# Patient Record
Sex: Female | Born: 1980
Health system: Southern US, Community
[De-identification: ages and names within clinical notes are randomized; demographics above are authoritative.]

## PROBLEM LIST (undated history)

## (undated) ENCOUNTER — Inpatient Hospital Stay (HOSPITAL_COMMUNITY): Payer: Self-pay

## (undated) DIAGNOSIS — B009 Herpesviral infection, unspecified: Secondary | ICD-10-CM

## (undated) DIAGNOSIS — Z789 Other specified health status: Secondary | ICD-10-CM

## (undated) HISTORY — PX: LEEP: SHX91

## (undated) HISTORY — PX: NO PAST SURGERIES: SHX2092

---

## 2001-10-20 ENCOUNTER — Other Ambulatory Visit: Admission: RE | Admit: 2001-10-20 | Discharge: 2001-10-20 | Payer: Self-pay | Admitting: Obstetrics and Gynecology

## 2003-03-31 ENCOUNTER — Other Ambulatory Visit: Admission: RE | Admit: 2003-03-31 | Discharge: 2003-03-31 | Payer: Self-pay | Admitting: Obstetrics and Gynecology

## 2004-02-04 ENCOUNTER — Other Ambulatory Visit: Admission: RE | Admit: 2004-02-04 | Discharge: 2004-02-04 | Payer: Self-pay | Admitting: Obstetrics and Gynecology

## 2005-01-26 ENCOUNTER — Inpatient Hospital Stay (HOSPITAL_COMMUNITY): Admission: AD | Admit: 2005-01-26 | Discharge: 2005-01-28 | Payer: Self-pay | Admitting: Obstetrics and Gynecology

## 2005-02-08 ENCOUNTER — Emergency Department (HOSPITAL_COMMUNITY): Admission: EM | Admit: 2005-02-08 | Discharge: 2005-02-08 | Payer: Self-pay | Admitting: Emergency Medicine

## 2005-03-09 ENCOUNTER — Other Ambulatory Visit: Admission: RE | Admit: 2005-03-09 | Discharge: 2005-03-09 | Payer: Self-pay | Admitting: Obstetrics and Gynecology

## 2007-07-31 ENCOUNTER — Inpatient Hospital Stay (HOSPITAL_COMMUNITY): Admission: AD | Admit: 2007-07-31 | Discharge: 2007-08-01 | Payer: Self-pay | Admitting: Obstetrics and Gynecology

## 2007-11-05 ENCOUNTER — Inpatient Hospital Stay (HOSPITAL_COMMUNITY): Admission: AD | Admit: 2007-11-05 | Discharge: 2007-11-07 | Payer: Self-pay | Admitting: Obstetrics and Gynecology

## 2010-05-18 ENCOUNTER — Emergency Department (HOSPITAL_COMMUNITY): Admission: EM | Admit: 2010-05-18 | Discharge: 2010-05-18 | Payer: Self-pay | Admitting: Family Medicine

## 2011-01-21 ENCOUNTER — Inpatient Hospital Stay (HOSPITAL_COMMUNITY): Admission: RE | Admit: 2011-01-21 | Payer: Self-pay | Source: Ambulatory Visit

## 2011-05-07 LAB — CBC
HCT: 36.3
Hemoglobin: 13.9
MCHC: 35.2
MCV: 96.5
Platelets: 234
RBC: 3.76 — ABNORMAL LOW
RBC: 4.18
WBC: 12.8 — ABNORMAL HIGH
WBC: 15 — ABNORMAL HIGH

## 2011-05-07 LAB — RPR: RPR Ser Ql: NONREACTIVE

## 2011-05-18 LAB — URINALYSIS, ROUTINE W REFLEX MICROSCOPIC
Hgb urine dipstick: NEGATIVE
Protein, ur: NEGATIVE
Urobilinogen, UA: 0.2

## 2013-07-31 LAB — OB RESULTS CONSOLE RUBELLA ANTIBODY, IGM: RUBELLA: IMMUNE

## 2013-07-31 LAB — OB RESULTS CONSOLE ABO/RH: RH TYPE: POSITIVE

## 2013-07-31 LAB — OB RESULTS CONSOLE HIV ANTIBODY (ROUTINE TESTING): HIV: NONREACTIVE

## 2013-07-31 LAB — OB RESULTS CONSOLE HEPATITIS B SURFACE ANTIGEN: HEP B S AG: NEGATIVE

## 2013-07-31 LAB — OB RESULTS CONSOLE RPR: RPR: NONREACTIVE

## 2013-07-31 LAB — OB RESULTS CONSOLE ANTIBODY SCREEN: ANTIBODY SCREEN: NEGATIVE

## 2013-08-13 NOTE — L&D Delivery Note (Signed)
Delivery Note At 5:04 PM a viable female was delivered via Vaginal, Spontaneous Delivery (Presentation:oa ;  ).  APGAR:8/9 , ; weight .  pending Placenta statusintact: , . 3 vessel Cord:  with the following complications:none .  Cord pH: na  Anesthesia: Epidural  Episiotomy: none Lacerations: first degree Suture Repair: 2.0 chromic Est. Blood Loss (mL): 350  Mom to postpartum.  Baby to Couplet care / Skin to Skin.  Jacqlyn Marolf S 03/10/2014, 5:17 PM

## 2013-08-20 LAB — OB RESULTS CONSOLE GC/CHLAMYDIA
Chlamydia: NEGATIVE
Gonorrhea: NEGATIVE

## 2013-09-03 ENCOUNTER — Other Ambulatory Visit: Payer: Self-pay

## 2013-10-13 ENCOUNTER — Other Ambulatory Visit (HOSPITAL_COMMUNITY): Payer: Self-pay | Admitting: Obstetrics and Gynecology

## 2013-10-13 DIAGNOSIS — O283 Abnormal ultrasonic finding on antenatal screening of mother: Secondary | ICD-10-CM

## 2013-10-13 DIAGNOSIS — IMO0002 Reserved for concepts with insufficient information to code with codable children: Secondary | ICD-10-CM

## 2013-10-14 ENCOUNTER — Encounter (HOSPITAL_COMMUNITY): Payer: Self-pay | Admitting: Obstetrics and Gynecology

## 2013-10-14 ENCOUNTER — Encounter (HOSPITAL_COMMUNITY): Payer: 59

## 2013-10-14 ENCOUNTER — Ambulatory Visit (HOSPITAL_COMMUNITY)
Admission: RE | Admit: 2013-10-14 | Discharge: 2013-10-14 | Disposition: A | Discharge status: Home / Self Care | Payer: 59 | Source: Ambulatory Visit | Attending: Obstetrics and Gynecology | Admitting: Obstetrics and Gynecology

## 2013-10-14 ENCOUNTER — Other Ambulatory Visit (HOSPITAL_COMMUNITY): Payer: 59

## 2013-10-14 ENCOUNTER — Encounter (HOSPITAL_COMMUNITY): Payer: Self-pay

## 2013-10-14 VITALS — BP 120/75 | HR 98 | Wt 149.0 lb

## 2013-10-14 DIAGNOSIS — IMO0002 Reserved for concepts with insufficient information to code with codable children: Secondary | ICD-10-CM

## 2013-10-14 DIAGNOSIS — O358XX0 Maternal care for other (suspected) fetal abnormality and damage, not applicable or unspecified: Secondary | ICD-10-CM | POA: Insufficient documentation

## 2013-10-14 DIAGNOSIS — Z363 Encounter for antenatal screening for malformations: Secondary | ICD-10-CM | POA: Insufficient documentation

## 2013-10-14 DIAGNOSIS — O283 Abnormal ultrasonic finding on antenatal screening of mother: Secondary | ICD-10-CM

## 2013-10-14 DIAGNOSIS — Z1389 Encounter for screening for other disorder: Secondary | ICD-10-CM | POA: Insufficient documentation

## 2013-10-14 NOTE — Progress Notes (Signed)
Genetic Counseling  High-Risk Gestation Note  Appointment Date:  10/14/2013 Referred By: Margarette Asal, MD Date of Birth:  January 30, 1981 Partner:  Ronald Pippins   Pregnancy History: G3P2 Estimated Date of Delivery: 03/10/14 Estimated Gestational Age: [redacted]w[redacted]d  I met with Mrs. Karen Siadfor genetic counseling because of an abnormal ultrasound finding.  Mrs. Karen Morales's sister also attended the genetic counseling appointment today.  [name] seen for her detailed anatomy ultrasound at [facility] on Monday, October 12, 2013.  Ultrasound revealed a unilateral clubfoot.  [facility] [place]  A complete ultrasound was performed today.  The report will be documented separately.  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  The remainder of the fetal anatomy was wnl.  The amniotic fluid volume was wnl. There were no markers suggestive of fetal aneuploidy.  We discussed that clubfoot is a term that actually describes three distinct anomalies (talipes equinovarus, talipes calcaneovalgus, and metatarsus varus) and occurs in 1 in 1000 births.  The most common type of clubfeet, talipes equinovarus, is characterized by forefoot adduction with supination, heel varus, and ankle equines, which cannot be brought back to a neutral position.  She was counseled that clubfeet can be an isolated difference, occur as a feature of an underlying syndrome, or the result of neurological impairment.  We discussed that the most likely mode of inheritance for nonsyndromic, isolated clubfoot is multifactorial.  There is a known genetic component for multifactorial clubfoot, as demonstrated by twin studies; environmental factors also play a role, including infection, drugs, and intrauterine environment (oligohydramnios, fetal positioning). We reviewed multifactorial inheritance and the  associated 3% risk of recurrence.  [name] counseled that while the majority of cases of clubfoot are isolated, it is a feature in more than 200 known genetic syndromes, including both chromosomal and single gene conditions.  We reviewed Mrs. Karen Morales's normal First trimester screening results and the associated significant reduction in risks (1 in 10,000) for fetal Down syndrome, trisomy 142 and trisomy 132  We reviewed other available options including noninvasive prenatal screening (NIPS)/cell free fetal DNA testing and amniocentesis, including the limitations, benefits, and risks of each.  She understands that these tests allow for evaluation of the fetal chromosomes, but cannot detect all genetic conditions.  We discussed that single gene conditions are difficult to diagnose prenatally unless a specific condition is suspected based on additional ultrasound findings or family history. Both family histories were reviewed and found to be noncontributory for birth defects, intellectual disability, and known genetic conditions. After thoughtful consideration of her options, Mrs. Karen Morales further testing.    Additionally, we discussed the option of meeting with a pediatric orthopedic specialist to discuss expectant management and treatment of clubfoot.  [name] a prenatal consult with orthopedics at this time.    [name] provided with written information regarding cystic fibrosis (CF) including the carrier frequency and incidence in the Caucasian population, the availability of carrier testing and prenatal diagnosis if indicated.  In addition, we discussed that CF is routinely screened for as part of the [place] newborn screening panel.  She declined testing today.   Mrs. Karen Morales exposure to environmental toxins or chemical agents. She denied the use of alcohol, tobacco or street drugs. She denied significant viral illnesses during the course of  her pregnancy. Her medical and surgical histories were noncontributory.   I  counseled Mrs. Karen Morales regarding the above risks and available options.  The approximate face-to-face time with the genetic counselor was 38 minutes.  Sharyne Richters, MS  Certified Genetic Counselor

## 2013-10-23 ENCOUNTER — Encounter (HOSPITAL_COMMUNITY): Payer: 59

## 2013-10-23 ENCOUNTER — Other Ambulatory Visit (HOSPITAL_COMMUNITY): Payer: 59

## 2014-01-27 ENCOUNTER — Encounter (HOSPITAL_COMMUNITY): Payer: Self-pay

## 2014-01-27 ENCOUNTER — Inpatient Hospital Stay (HOSPITAL_COMMUNITY)
Admission: AD | Admit: 2014-01-27 | Discharge: 2014-01-28 | Disposition: A | Discharge status: Home / Self Care | Payer: 59 | Source: Ambulatory Visit | Attending: Obstetrics and Gynecology | Admitting: Obstetrics and Gynecology

## 2014-01-27 DIAGNOSIS — O26859 Spotting complicating pregnancy, unspecified trimester: Secondary | ICD-10-CM | POA: Insufficient documentation

## 2014-01-27 DIAGNOSIS — O26853 Spotting complicating pregnancy, third trimester: Secondary | ICD-10-CM

## 2014-01-27 DIAGNOSIS — N930 Postcoital and contact bleeding: Secondary | ICD-10-CM

## 2014-01-27 NOTE — MAU Note (Signed)
Pt c/o a small of blood on pantyliner right before she went to bed. Called on call nurse who told her to come in. Denies pain but has some pressure. Denies LOF. +FM

## 2014-01-28 DIAGNOSIS — O26859 Spotting complicating pregnancy, unspecified trimester: Secondary | ICD-10-CM

## 2014-01-28 LAB — URINALYSIS, ROUTINE W REFLEX MICROSCOPIC
Bilirubin Urine: NEGATIVE
Glucose, UA: NEGATIVE mg/dL
Ketones, ur: NEGATIVE mg/dL
NITRITE: NEGATIVE
PH: 5.5 (ref 5.0–8.0)
Protein, ur: NEGATIVE mg/dL
UROBILINOGEN UA: 0.2 mg/dL (ref 0.0–1.0)

## 2014-01-28 LAB — URINE MICROSCOPIC-ADD ON

## 2014-01-28 NOTE — Discharge Instructions (Signed)
Vaginal Bleeding During Pregnancy, Third Trimester A small amount of bleeding (spotting) from the vagina is relatively common in pregnancy. Various things can cause bleeding or spotting in pregnancy. Sometimes the bleeding is normal and is not a problem. However, bleeding during the third trimester can also be a sign of something serious for the mother and the baby. Be sure to tell your health care provider about any vaginal bleeding right away.  Some possible causes of vaginal bleeding during the third trimester include:   The placenta may be partially or completely covering the opening to the cervix (placenta previa).   The placenta may have separated from the uterus (abruption of the placenta).   There may be an infection or growth on the cervix.   You may be starting labor, called discharging of the mucus plug.   The placenta may grow into the muscle layer of the uterus (placenta accreta).  HOME CARE INSTRUCTIONS  Watch your condition for any changes. The following actions may help to lessen any discomfort you are feeling:   Follow your health care provider's instructions for limiting your activity. If your health care provider orders bed rest, you may need to stay in bed and only get up to use the bathroom. However, your health care provider may allow you to continue light activity.  If needed, make plans for someone to help with your regular activities and responsibilities while you are on bed rest.  Keep track of the number of pads you use each day, how often you change pads, and how soaked (saturated) they are. Write this down.  Do not use tampons. Do not douche.  Do not have sexual intercourse or orgasms until approved by your health care provider.  Follow your health care provider's advice about lifting, driving, and physical activities.  If you pass any tissue from your vagina, save the tissue so you can show it to your health care provider.   Only take over-the-counter  or prescription medicines as directed by your health care provider.  Do not take aspirin because it can make you bleed.   Keep all follow-up appointments as directed by your health care provider. SEEK MEDICAL CARE IF:  You have any vaginal bleeding during any part of your pregnancy.  You have cramps or labor pains.  You have a fever, not controlled by medicine. SEEK IMMEDIATE MEDICAL CARE IF:   You have severe cramps or pain in your back or belly (abdomen).  You have chills.  You have a gush of fluid from the vagina.  You pass large clots or tissue from your vagina.  Your bleeding increases.  You feel light-headed or weak.  You pass out.  You feel less movement or no movement of the baby.  MAKE SURE YOU:  Understand these instructions.  Will watch your condition.  Will get help right away if you are not doing well or get worse. Document Released: 10/20/2002 Document Revised: 08/04/2013 Document Reviewed: 04/06/2013 [facility] Patient Information 2015 [place], [place]. This information is not intended to replace advice given to you by your health care provider. Make sure you discuss any questions you have with your health care provider.  Braxton Hicks Contractions Contractions of the uterus can occur throughout pregnancy. Contractions are not always a sign that you are in labor.  WHAT ARE BRAXTON HICKS CONTRACTIONS?  Contractions that occur before labor are called Braxton Hicks contractions, or false labor. Toward the end of pregnancy (32-34 weeks), these contractions can develop more often and  may become more forceful. This is not true labor because these contractions do not result in opening (dilatation) and thinning of the cervix. They are sometimes difficult to tell apart from true labor because these contractions can be forceful and people have different pain tolerances. You should not feel embarrassed if you go to the hospital with false labor. Sometimes, the only way to  tell if you are in true labor is for your health care provider to look for changes in the cervix. If there are no prenatal problems or other health problems associated with the pregnancy, it is completely safe to be sent home with false labor and await the onset of true labor. HOW CAN YOU TELL THE DIFFERENCE BETWEEN TRUE AND FALSE LABOR? False Labor  The contractions of false labor are usually shorter and not as hard as those of true labor.   The contractions are usually irregular.   The contractions are often felt in the front of the lower abdomen and in the groin.   The contractions may go away when you walk around or change positions while lying down.   The contractions get weaker and are shorter lasting as time goes on.   The contractions do not usually become progressively stronger, regular, and closer together as with true labor.  True Labor  Contractions in true labor last 30-70 seconds, become very regular, usually become more intense, and increase in frequency.   The contractions do not go away with walking.   The discomfort is usually felt in the top of the uterus and spreads to the lower abdomen and low back.   True labor can be determined by your health care provider with an exam. This will show that the cervix is dilating and getting thinner.  WHAT TO REMEMBER  Keep up with your usual exercises and follow other instructions given by your health care provider.   Take medicines as directed by your health care provider.   Keep your regular prenatal appointments.   Eat and drink lightly if you think you are going into labor.   If Braxton Hicks contractions are making you uncomfortable:   Change your position from lying down or resting to walking, or from walking to resting.   Sit and rest in a tub of warm water.   Drink 2-3 glasses of water. Dehydration may cause these contractions.   Do slow and deep breathing several times an hour.  WHEN  SHOULD I SEEK IMMEDIATE MEDICAL CARE? Seek immediate medical care if:  Your contractions become stronger, more regular, and closer together.   You have fluid leaking or gushing from your vagina.   You have a fever.   You pass blood-tinged mucus.   You have vaginal bleeding.   You have continuous abdominal pain.   You have low back pain that you never had before.   You feel your baby's head pushing down and causing pelvic pressure.   Your baby is not moving as much as it used to.  Document Released: 07/30/2005 Document Revised: 08/04/2013 Document Reviewed: 05/11/2013 [facility] Patient Information 2015 [place], [place]. This information is not intended to replace advice given to you by your health care provider. Make sure you discuss any questions you have with your health care provider.  Fetal Movement Counts Patient Name: __________________________________________________ Patient Due Date: ____________________ Performing a fetal movement count is highly recommended in high-risk pregnancies, but it is good for every pregnant woman to do. Your caregiver may ask you to start counting fetal  movements at 28 weeks of the pregnancy. Fetal movements often increase:  After eating a full meal.  After physical activity.  After eating or drinking something sweet or cold.  At rest. Pay attention to when you feel the baby is most active. This will help you notice a pattern of your baby's sleep and wake cycles and what factors contribute to an increase in fetal movement. It is important to perform a fetal movement count at the same time each day when your baby is normally most active.  HOW TO COUNT FETAL MOVEMENTS 1. Find a quiet and comfortable area to sit or lie down on your left side. Lying on your left side provides the best blood and oxygen circulation to your baby. 2. Write down the day and time on a sheet of paper or in a journal. 3. Start counting kicks, flutters, swishes,  rolls, or jabs in a 2 hour period. You should feel at least 10 movements within 2 hours. 4. If you do not feel 10 movements in 2 hours, wait 2-3 hours and count again. Look for a change in the pattern or not enough counts in 2 hours. SEEK MEDICAL CARE IF:  You feel less than 10 counts in 2 hours, tried twice.  There is no movement in over an hour.  The pattern is changing or taking longer each day to reach 10 counts in 2 hours.  You feel the baby is not moving as he or she usually does. Date: ____________ Movements: ____________ Start time: ____________ Doreatha Martin time: ____________  Date: ____________ Movements: ____________ Start time: ____________ Doreatha Martin time: ____________ Date: ____________ Movements: ____________ Start time: ____________ Doreatha Martin time: ____________ Date: ____________ Movements: ____________ Start time: ____________ Doreatha Martin time: ____________ Date: ____________ Movements: ____________ Start time: ____________ Doreatha Martin time: ____________ Date: ____________ Movements: ____________ Start time: ____________ Doreatha Martin time: ____________ Date: ____________ Movements: ____________ Start time: ____________ Doreatha Martin time: ____________ Date: ____________ Movements: ____________ Start time: ____________ Doreatha Martin time: ____________  Date: ____________ Movements: ____________ Start time: ____________ Doreatha Martin time: ____________ Date: ____________ Movements: ____________ Start time: ____________ Doreatha Martin time: ____________ Date: ____________ Movements: ____________ Start time: ____________ Doreatha Martin time: ____________ Date: ____________ Movements: ____________ Start time: ____________ Doreatha Martin time: ____________ Date: ____________ Movements: ____________ Start time: ____________ Doreatha Martin time: ____________ Date: ____________ Movements: ____________ Start time: ____________ Doreatha Martin time: ____________ Date: ____________ Movements: ____________ Start time: ____________ Doreatha Martin time: ____________  Date: ____________  Movements: ____________ Start time: ____________ Doreatha Martin time: ____________ Date: ____________ Movements: ____________ Start time: ____________ Doreatha Martin time: ____________ Date: ____________ Movements: ____________ Start time: ____________ Doreatha Martin time: ____________ Date: ____________ Movements: ____________ Start time: ____________ Doreatha Martin time: ____________ Date: ____________ Movements: ____________ Start time: ____________ Doreatha Martin time: ____________ Date: ____________ Movements: ____________ Start time: ____________ Doreatha Martin time: ____________ Date: ____________ Movements: ____________ Start time: ____________ Doreatha Martin time: ____________  Date: ____________ Movements: ____________ Start time: ____________ Doreatha Martin time: ____________ Date: ____________ Movements: ____________ Start time: ____________ Doreatha Martin time: ____________ Date: ____________ Movements: ____________ Start time: ____________ Doreatha Martin time: ____________ Date: ____________ Movements: ____________ Start time: ____________ Doreatha Martin time: ____________ Date: ____________ Movements: ____________ Start time: ____________ Doreatha Martin time: ____________ Date: ____________ Movements: ____________ Start time: ____________ Doreatha Martin time: ____________ Date: ____________ Movements: ____________ Start time: ____________ Doreatha Martin time: ____________  Date: ____________ Movements: ____________ Start time: ____________ Doreatha Martin time: ____________ Date: ____________ Movements: ____________ Start time: ____________ Doreatha Martin time: ____________ Date: ____________ Movements: ____________ Start time: ____________ Doreatha Martin time: ____________ Date: ____________ Movements: ____________ Start time: ____________ Doreatha Martin time: ____________ Date: ____________ Movements: ____________ Start time: ____________ Doreatha Martin time:  ____________ Date: ____________ Movements: ____________ Start time: ____________ Doreatha MartinFinish time: ____________ Date: ____________ Movements: ____________ Start time:  ____________ Doreatha MartinFinish time: ____________  Date: ____________ Movements: ____________ Start time: ____________ Doreatha MartinFinish time: ____________ Date: ____________ Movements: ____________ Start time: ____________ Doreatha MartinFinish time: ____________ Date: ____________ Movements: ____________ Start time: ____________ Doreatha MartinFinish time: ____________ Date: ____________ Movements: ____________ Start time: ____________ Doreatha MartinFinish time: ____________ Date: ____________ Movements: ____________ Start time: ____________ Doreatha MartinFinish time: ____________ Date: ____________ Movements: ____________ Start time: ____________ Doreatha MartinFinish time: ____________ Date: ____________ Movements: ____________ Start time: ____________ Doreatha MartinFinish time: ____________  Date: ____________ Movements: ____________ Start time: ____________ Doreatha MartinFinish time: ____________ Date: ____________ Movements: ____________ Start time: ____________ Doreatha MartinFinish time: ____________ Date: ____________ Movements: ____________ Start time: ____________ Doreatha MartinFinish time: ____________ Date: ____________ Movements: ____________ Start time: ____________ Doreatha MartinFinish time: ____________ Date: ____________ Movements: ____________ Start time: ____________ Doreatha MartinFinish time: ____________ Date: ____________ Movements: ____________ Start time: ____________ Doreatha MartinFinish time: ____________ Date: ____________ Movements: ____________ Start time: ____________ Doreatha MartinFinish time: ____________  Date: ____________ Movements: ____________ Start time: ____________ Doreatha MartinFinish time: ____________ Date: ____________ Movements: ____________ Start time: ____________ Doreatha MartinFinish time: ____________ Date: ____________ Movements: ____________ Start time: ____________ Doreatha MartinFinish time: ____________ Date: ____________ Movements: ____________ Start time: ____________ Doreatha MartinFinish time: ____________ Date: ____________ Movements: ____________ Start time: ____________ Doreatha MartinFinish time: ____________ Date: ____________ Movements: ____________ Start time: ____________ Doreatha MartinFinish time:  ____________ Document Released: 08/29/2006 Document Revised: 07/16/2012 Document Reviewed: 05/26/2012 [employer] Patient Information 2015 [place], [place]. This information is not intended to replace advice given to you by your health care provider. Make sure you discuss any questions you have with your health care provider.

## 2014-01-28 NOTE — MAU Provider Note (Signed)
Chief Complaint:  Vaginal Bleeding   First Provider Initiated Contact with Patient 01/28/14 0027     HPI: Karen Morales is a 33 y.o. G3P2002 at 6645w1d who presents to maternity admissions reporting spotting this evening, and increased pelvic pressure over the last day. Reports few contractions, but uncertain how many per hour. Probably less than 5 per hour. Had intercourse this evening. Blood type A pos. Placenta posterior left, above os per anatomy scan.   Denies fever, chills, urinary complaints, leakage of fluid or abdominal trauma. Good fetal movement.   Pregnancy Course: Complicated by clubfoot   Past Medical History: History reviewed. No pertinent past medical history.  Past obstetric history: OB History  Gravida Para Term Preterm AB SAB TAB Ectopic Multiple Living  3 2 2       2     # Outcome Date GA Lbr Len/2nd Weight Sex Delivery Anes PTL Lv  3 CUR           2 TRM      SVD   Y  1 TRM      SVD   Y      Past Surgical History: History reviewed. No pertinent past surgical history.   Family History: History reviewed. No pertinent family history.  Social History: History  Substance Use Topics  . Smoking status: Never Smoker   . Smokeless tobacco: Never Used  . Alcohol Use: No    Allergies:  Allergies  Allergen Reactions  . Penicillins     Meds:  Prescriptions prior to admission  Medication Sig Dispense Refill  . Prenatal Vit-Fe Fumarate-FA (PRENATAL MULTIVITAMIN) TABS tablet Take 1 tablet by mouth daily at 12 noon.        ROS: Pertinent findings in history of present illness.  Physical Exam  Blood pressure 114/66, pulse 85, temperature 98.9 F (37.2 C), temperature source Oral, resp. rate 18, height 5\' 3"  (1.6 m), weight 69.854 kg (154 lb), last menstrual period 06/03/2013, SpO2 100.00%. GENERAL: Well-developed, well-nourished female in no acute distress.  HEENT: normocephalic HEART: normal rate RESP: normal effort ABDOMEN: Soft, non-tender, gravid  appropriate for gestational age EXTREMITIES: Nontender, no edema NEURO: alert and oriented SPECULUM EXAM: NEFG, physiologic discharge, scant, pink blood, cervix clean, but incompletely visualized. Dilation: Closed Effacement (%): Thick Cervical Position: Posterior Exam by:: Ivonne AndrewV. Sharrod Achille CNM  FHT:  Baseline 130 , moderate variability, accelerations present, no decelerations Contractions: 2 contractions in one hour   Labs: Results for orders placed during the hospital encounter of 01/27/14 (from the past 24 hour(s))  URINALYSIS, ROUTINE W REFLEX MICROSCOPIC     Status: Abnormal   Collection Time    01/27/14 11:43 PM      Result Value Ref Range   Color, Urine YELLOW  YELLOW   APPearance CLEAR  CLEAR   Specific Gravity, Urine <1.005 (*) 1.005 - 1.030   pH 5.5  5.0 - 8.0   Glucose, UA NEGATIVE  NEGATIVE mg/dL   Hgb urine dipstick LARGE (*) NEGATIVE   Bilirubin Urine NEGATIVE  NEGATIVE   Ketones, ur NEGATIVE  NEGATIVE mg/dL   Protein, ur NEGATIVE  NEGATIVE mg/dL   Urobilinogen, UA 0.2  0.0 - 1.0 mg/dL   Nitrite NEGATIVE  NEGATIVE   Leukocytes, UA TRACE (*) NEGATIVE  URINE MICROSCOPIC-ADD ON     Status: Abnormal   Collection Time    01/27/14 11:43 PM      Result Value Ref Range   Squamous Epithelial / LPF FEW (*) RARE   WBC, UA  0-2  <3 WBC/hpf   RBC / HPF 0-2  <3 RBC/hpf   Bacteria, UA RARE  RARE    Imaging:  No results found. MAU Course:   Assessment: 1. Spotting complicating pregnancy in third trimester   2. PCB (post coital bleeding)     Plan: Discharge home in stable condition per consult with Dr. Arelia SneddonMcComb. Preterm labor precautions and fetal kick counts. Bleeding precautions. Pelvic rest x1 week.     Follow-up Information   [facility] [place] [place] (As scheduled or as needed if symptoms worsen)    Contact information:   [address] [place] [id number] [phone]      Follow up with [facility]  [place]. (As needed in emergencies)    Contact information:   [address] [id number] [place] [id number] [phone]       Medication List         prenatal multivitamin Tabs tablet  Take 1 tablet by mouth daily at 12 noon.        [place] Amelita Risinger, CNM 01/28/2014 12:50 AM

## 2014-02-09 LAB — OB RESULTS CONSOLE GBS: GBS: POSITIVE

## 2014-02-20 ENCOUNTER — Encounter (HOSPITAL_COMMUNITY): Payer: Self-pay

## 2014-02-20 ENCOUNTER — Inpatient Hospital Stay (HOSPITAL_COMMUNITY)
Admission: AD | Admit: 2014-02-20 | Discharge: 2014-02-21 | Disposition: A | Discharge status: Home / Self Care | Payer: 59 | Source: Ambulatory Visit | Attending: Obstetrics and Gynecology | Admitting: Obstetrics and Gynecology

## 2014-02-20 DIAGNOSIS — O479 False labor, unspecified: Secondary | ICD-10-CM | POA: Insufficient documentation

## 2014-02-20 HISTORY — DX: Other specified health status: Z78.9

## 2014-02-20 NOTE — MAU Note (Signed)
Felt crampy all afternoon. Ctxs since 2015 that have been closer together. Denies bleeding or leaking fld

## 2014-02-21 NOTE — Discharge Instructions (Signed)
Braxton Hicks Contractions °Contractions of the uterus can occur throughout pregnancy. Contractions are not always a sign that you are in labor.  °WHAT ARE BRAXTON HICKS CONTRACTIONS?  °Contractions that occur before labor are called Braxton Hicks contractions, or false labor. Toward the end of pregnancy (32-34 weeks), these contractions can develop more often and may become more forceful. This is not true labor because these contractions do not result in opening (dilatation) and thinning of the cervix. They are sometimes difficult to tell apart from true labor because these contractions can be forceful and people have different pain tolerances. You should not feel embarrassed if you go to the hospital with false labor. Sometimes, the only way to tell if you are in true labor is for your health care provider to look for changes in the cervix. °If there are no prenatal problems or other health problems associated with the pregnancy, it is completely safe to be sent home with false labor and await the onset of true labor. °HOW CAN YOU TELL THE DIFFERENCE BETWEEN TRUE AND FALSE LABOR? °False Labor °· The contractions of false labor are usually shorter and not as hard as those of true labor.   °· The contractions are usually irregular.   °· The contractions are often felt in the front of the lower abdomen and in the groin.   °· The contractions may go away when you walk around or change positions while lying down.   °· The contractions get weaker and are shorter lasting as time goes on.   °· The contractions do not usually become progressively stronger, regular, and closer together as with true labor.   °True Labor °· Contractions in true labor last 30-70 seconds, become very regular, usually become more intense, and increase in frequency.   °· The contractions do not go away with walking.   °· The discomfort is usually felt in the top of the uterus and spreads to the lower abdomen and low back.   °· True labor can be  determined by your health care provider with an exam. This will show that the cervix is dilating and getting thinner.   °WHAT TO REMEMBER °· Keep up with your usual exercises and follow other instructions given by your health care provider.   °· Take medicines as directed by your health care provider.   °· Keep your regular prenatal appointments.   °· Eat and drink lightly if you think you are going into labor.   °· If Braxton Hicks contractions are making you uncomfortable:   °¨ Change your position from lying down or resting to walking, or from walking to resting.   °¨ Sit and rest in a tub of warm water.   °¨ Drink 2-3 glasses of water. Dehydration may cause these contractions.   °¨ Do slow and deep breathing several times an hour.   °WHEN SHOULD I SEEK IMMEDIATE MEDICAL CARE? °Seek immediate medical care if: °· Your contractions become stronger, more regular, and closer together.   °· You have fluid leaking or gushing from your vagina.   °· You have a fever.   °· You pass blood-tinged mucus.   °· You have vaginal bleeding.   °· You have continuous abdominal pain.   °· You have low back pain that you never had before.   °· You feel your baby's head pushing down and causing pelvic pressure.   °· Your baby is not moving as much as it used to.   °Document Released: 07/30/2005 Document Revised: 08/04/2013 Document Reviewed: 05/11/2013 °Exit[employer]® Patient Information ©2015 [employer]. This information is not intended to replace advice given to you by your health care   provider. Make sure you discuss any questions you have with your health care provider. ° °Fetal Movement Counts °Patient Name: __________________________________________________ Patient Due Date: ____________________ °Performing a fetal movement count is highly recommended in high-risk pregnancies, but it is good for every pregnant woman to do. Your caregiver may ask you to start counting fetal movements at 28 weeks of the pregnancy. Fetal movements  often increase: °· After eating a full meal. °· After physical activity. °· After eating or drinking something sweet or cold. °· At rest. °Pay attention to when you feel the baby is most active. This will help you notice a pattern of your baby's sleep and wake cycles and what factors contribute to an increase in fetal movement. It is important to perform a fetal movement count at the same time each day when your baby is normally most active.  °HOW TO COUNT FETAL MOVEMENTS °1. Find a quiet and comfortable area to sit or lie down on your left side. Lying on your left side provides the best blood and oxygen circulation to your baby. °2. Write down the day and time on a sheet of paper or in a journal. °3. Start counting kicks, flutters, swishes, rolls, or jabs in a 2 hour period. You should feel at least 10 movements within 2 hours. °4. If you do not feel 10 movements in 2 hours, wait 2-3 hours and count again. Look for a change in the pattern or not enough counts in 2 hours. °SEEK MEDICAL CARE IF: °· You feel less than 10 counts in 2 hours, tried twice. °· There is no movement in over an hour. °· The pattern is changing or taking longer each day to reach 10 counts in 2 hours. °· You feel the baby is not moving as he or she usually does. °Date: ____________ Movements: ____________ Start time: ____________ Finish time: ____________  °Date: ____________ Movements: ____________ Start time: ____________ Finish time: ____________ °Date: ____________ Movements: ____________ Start time: ____________ Finish time: ____________ °Date: ____________ Movements: ____________ Start time: ____________ Finish time: ____________ °Date: ____________ Movements: ____________ Start time: ____________ Finish time: ____________ °Date: ____________ Movements: ____________ Start time: ____________ Finish time: ____________ °Date: ____________ Movements: ____________ Start time: ____________ Finish time: ____________ °Date: ____________  Movements: ____________ Start time: ____________ Finish time: ____________  °Date: ____________ Movements: ____________ Start time: ____________ Finish time: ____________ °Date: ____________ Movements: ____________ Start time: ____________ Finish time: ____________ °Date: ____________ Movements: ____________ Start time: ____________ Finish time: ____________ °Date: ____________ Movements: ____________ Start time: ____________ Finish time: ____________ °Date: ____________ Movements: ____________ Start time: ____________ Finish time: ____________ °Date: ____________ Movements: ____________ Start time: ____________ Finish time: ____________ °Date: ____________ Movements: ____________ Start time: ____________ Finish time: ____________  °Date: ____________ Movements: ____________ Start time: ____________ Finish time: ____________ °Date: ____________ Movements: ____________ Start time: ____________ Finish time: ____________ °Date: ____________ Movements: ____________ Start time: ____________ Finish time: ____________ °Date: ____________ Movements: ____________ Start time: ____________ Finish time: ____________ °Date: ____________ Movements: ____________ Start time: ____________ Finish time: ____________ °Date: ____________ Movements: ____________ Start time: ____________ Finish time: ____________ °Date: ____________ Movements: ____________ Start time: ____________ Finish time: ____________  °Date: ____________ Movements: ____________ Start time: ____________ Finish time: ____________ °Date: ____________ Movements: ____________ Start time: ____________ Finish time: ____________ °Date: ____________ Movements: ____________ Start time: ____________ Finish time: ____________ °Date: ____________ Movements: ____________ Start time: ____________ Finish time: ____________ °Date: ____________ Movements: ____________ Start time: ____________ Finish time: ____________ °Date: ____________ Movements: ____________ Start time:  ____________ Finish time: ____________ °Date: ____________ Movements: ____________   Start time: ____________ Finish time: ____________  °Date: ____________ Movements: ____________ Start time: ____________ Finish time: ____________ °Date: ____________ Movements: ____________ Start time: ____________ Finish time: ____________ °Date: ____________ Movements: ____________ Start time: ____________ Finish time: ____________ °Date: ____________ Movements: ____________ Start time: ____________ Finish time: ____________ °Date: ____________ Movements: ____________ Start time: ____________ Finish time: ____________ °Date: ____________ Movements: ____________ Start time: ____________ Finish time: ____________ °Date: ____________ Movements: ____________ Start time: ____________ Finish time: ____________  °Date: ____________ Movements: ____________ Start time: ____________ Finish time: ____________ °Date: ____________ Movements: ____________ Start time: ____________ Finish time: ____________ °Date: ____________ Movements: ____________ Start time: ____________ Finish time: ____________ °Date: ____________ Movements: ____________ Start time: ____________ Finish time: ____________ °Date: ____________ Movements: ____________ Start time: ____________ Finish time: ____________ °Date: ____________ Movements: ____________ Start time: ____________ Finish time: ____________ °Date: ____________ Movements: ____________ Start time: ____________ Finish time: ____________  °Date: ____________ Movements: ____________ Start time: ____________ Finish time: ____________ °Date: ____________ Movements: ____________ Start time: ____________ Finish time: ____________ °Date: ____________ Movements: ____________ Start time: ____________ Finish time: ____________ °Date: ____________ Movements: ____________ Start time: ____________ Finish time: ____________ °Date: ____________ Movements: ____________ Start time: ____________ Finish time: ____________ °Date:  ____________ Movements: ____________ Start time: ____________ Finish time: ____________ °Date: ____________ Movements: ____________ Start time: ____________ Finish time: ____________  °Date: ____________ Movements: ____________ Start time: ____________ Finish time: ____________ °Date: ____________ Movements: ____________ Start time: ____________ Finish time: ____________ °Date: ____________ Movements: ____________ Start time: ____________ Finish time: ____________ °Date: ____________ Movements: ____________ Start time: ____________ Finish time: ____________ °Date: ____________ Movements: ____________ Start time: ____________ Finish time: ____________ °Date: ____________ Movements: ____________ Start time: ____________ Finish time: ____________ °Document Released: 08/29/2006 Document Revised: 07/16/2012 Document Reviewed: 05/26/2012 °[employer]® Patient Information ©2015 [employer]. This information is not intended to replace advice given to you by your health care provider. Make sure you discuss any questions you have with your health care provider. ° °

## 2014-03-07 ENCOUNTER — Encounter (HOSPITAL_COMMUNITY): Payer: Self-pay

## 2014-03-07 ENCOUNTER — Inpatient Hospital Stay (HOSPITAL_COMMUNITY)
Admission: AD | Admit: 2014-03-07 | Discharge: 2014-03-07 | Disposition: A | Discharge status: Home / Self Care | Payer: 59 | Source: Ambulatory Visit | Attending: Obstetrics and Gynecology | Admitting: Obstetrics and Gynecology

## 2014-03-07 DIAGNOSIS — O479 False labor, unspecified: Secondary | ICD-10-CM | POA: Insufficient documentation

## 2014-03-07 HISTORY — DX: Herpesviral infection, unspecified: B00.9

## 2014-03-07 NOTE — MAU Note (Signed)
Contractions every 5 minutes since 8 pm. Denies LOF or vaginal bleeding. Positive fetal movement. Was dilated 2-3 cm on Friday.

## 2014-03-09 ENCOUNTER — Telehealth (HOSPITAL_COMMUNITY): Payer: Self-pay | Admitting: *Deleted

## 2014-03-09 NOTE — Telephone Encounter (Signed)
Preadmission screen  

## 2014-03-10 ENCOUNTER — Inpatient Hospital Stay (HOSPITAL_COMMUNITY)
Admission: RE | Admit: 2014-03-10 | Discharge: 2014-03-12 | DRG: 774 | Disposition: A | Discharge status: Home / Self Care | Payer: 59 | Source: Ambulatory Visit | Attending: Obstetrics and Gynecology | Admitting: Obstetrics and Gynecology

## 2014-03-10 ENCOUNTER — Encounter (HOSPITAL_COMMUNITY): Payer: Self-pay

## 2014-03-10 ENCOUNTER — Encounter (HOSPITAL_COMMUNITY): Payer: 59 | Admitting: Anesthesiology

## 2014-03-10 ENCOUNTER — Inpatient Hospital Stay (HOSPITAL_COMMUNITY): Payer: 59 | Admitting: Anesthesiology

## 2014-03-10 DIAGNOSIS — Z22330 Carrier of Group B streptococcus: Secondary | ICD-10-CM

## 2014-03-10 DIAGNOSIS — O9989 Other specified diseases and conditions complicating pregnancy, childbirth and the puerperium: Secondary | ICD-10-CM

## 2014-03-10 DIAGNOSIS — Z3483 Encounter for supervision of other normal pregnancy, third trimester: Secondary | ICD-10-CM

## 2014-03-10 DIAGNOSIS — O358XX0 Maternal care for other (suspected) fetal abnormality and damage, not applicable or unspecified: Secondary | ICD-10-CM | POA: Diagnosis present

## 2014-03-10 DIAGNOSIS — A6000 Herpesviral infection of urogenital system, unspecified: Secondary | ICD-10-CM | POA: Diagnosis present

## 2014-03-10 DIAGNOSIS — O99892 Other specified diseases and conditions complicating childbirth: Secondary | ICD-10-CM | POA: Diagnosis present

## 2014-03-10 DIAGNOSIS — O98519 Other viral diseases complicating pregnancy, unspecified trimester: Secondary | ICD-10-CM | POA: Diagnosis present

## 2014-03-10 LAB — CBC
HEMATOCRIT: 40.1 % (ref 36.0–46.0)
Hemoglobin: 13.8 g/dL (ref 12.0–15.0)
MCH: 32.7 pg (ref 26.0–34.0)
MCHC: 34.4 g/dL (ref 30.0–36.0)
MCV: 95 fL (ref 78.0–100.0)
Platelets: 218 10*3/uL (ref 150–400)
RBC: 4.22 MIL/uL (ref 3.87–5.11)
RDW: 14.1 % (ref 11.5–15.5)
WBC: 9.3 10*3/uL (ref 4.0–10.5)

## 2014-03-10 LAB — RPR

## 2014-03-10 MED ORDER — TETANUS-DIPHTH-ACELL PERTUSSIS 5-2.5-18.5 LF-MCG/0.5 IM SUSP: 0.5000 mL | Freq: Once | INTRAMUSCULAR | Status: DC

## 2014-03-10 MED ORDER — BISACODYL 10 MG RE SUPP: 10.0000 mg | Freq: Every day | RECTAL | Status: DC | PRN

## 2014-03-10 MED ORDER — LIDOCAINE HCL (PF) 1 % IJ SOLN
30.0000 mL | INTRAMUSCULAR | Status: DC | PRN
  Administered 2014-03-10: 30 mL via SUBCUTANEOUS
  Filled 2014-03-10: qty 30

## 2014-03-10 MED ORDER — DIBUCAINE 1 % RE OINT: 1.0000 "application " | TOPICAL_OINTMENT | RECTAL | Status: DC | PRN

## 2014-03-10 MED ORDER — EPHEDRINE 5 MG/ML INJ
10.0000 mg | INTRAVENOUS | Status: DC | PRN
  Filled 2014-03-10: qty 2

## 2014-03-10 MED ORDER — ZOLPIDEM TARTRATE 5 MG PO TABS: 5.0000 mg | ORAL_TABLET | Freq: Every evening | ORAL | Status: DC | PRN

## 2014-03-10 MED ORDER — VANCOMYCIN HCL IN DEXTROSE 1-5 GM/200ML-% IV SOLN
1000.0000 mg | Freq: Two times a day (BID) | INTRAVENOUS | Status: DC
  Administered 2014-03-10: 1000 mg via INTRAVENOUS
  Filled 2014-03-10 (×2): qty 200

## 2014-03-10 MED ORDER — ACETAMINOPHEN 325 MG PO TABS: 650.0000 mg | ORAL_TABLET | ORAL | Status: DC | PRN

## 2014-03-10 MED ORDER — PHENYLEPHRINE 40 MCG/ML (10ML) SYRINGE FOR IV PUSH (FOR BLOOD PRESSURE SUPPORT)
PREFILLED_SYRINGE | INTRAVENOUS | Status: AC
  Filled 2014-03-10: qty 10

## 2014-03-10 MED ORDER — IBUPROFEN 600 MG PO TABS: 600.0000 mg | ORAL_TABLET | Freq: Four times a day (QID) | ORAL | Status: DC | PRN

## 2014-03-10 MED ORDER — LIDOCAINE HCL (PF) 1 % IJ SOLN
INTRAMUSCULAR | Status: DC | PRN
  Administered 2014-03-10 (×4): 4 mL

## 2014-03-10 MED ORDER — ONDANSETRON HCL 4 MG/2ML IJ SOLN: 4.0000 mg | INTRAMUSCULAR | Status: DC | PRN

## 2014-03-10 MED ORDER — ONDANSETRON HCL 4 MG PO TABS: 4.0000 mg | ORAL_TABLET | ORAL | Status: DC | PRN

## 2014-03-10 MED ORDER — PHENYLEPHRINE 40 MCG/ML (10ML) SYRINGE FOR IV PUSH (FOR BLOOD PRESSURE SUPPORT)
80.0000 ug | PREFILLED_SYRINGE | INTRAVENOUS | Status: DC | PRN
  Filled 2014-03-10: qty 2

## 2014-03-10 MED ORDER — FLEET ENEMA 7-19 GM/118ML RE ENEM: 1.0000 | ENEMA | RECTAL | Status: DC | PRN

## 2014-03-10 MED ORDER — WITCH HAZEL-GLYCERIN EX PADS: 1.0000 "application " | MEDICATED_PAD | CUTANEOUS | Status: DC | PRN

## 2014-03-10 MED ORDER — OXYTOCIN 40 UNITS IN LACTATED RINGERS INFUSION - SIMPLE MED
1.0000 m[IU]/min | INTRAVENOUS | Status: DC
  Administered 2014-03-10: 2 m[IU]/min via INTRAVENOUS
  Filled 2014-03-10: qty 1000

## 2014-03-10 MED ORDER — FLEET ENEMA 7-19 GM/118ML RE ENEM
1.0000 | ENEMA | Freq: Every day | RECTAL | Status: DC | PRN
Start: 2014-03-10 — End: 2014-03-12

## 2014-03-10 MED ORDER — OXYCODONE-ACETAMINOPHEN 5-325 MG PO TABS: 1.0000 | ORAL_TABLET | ORAL | Status: DC | PRN

## 2014-03-10 MED ORDER — LACTATED RINGERS IV SOLN: 500.0000 mL | Freq: Once | INTRAVENOUS | Status: DC

## 2014-03-10 MED ORDER — CITRIC ACID-SODIUM CITRATE 334-500 MG/5ML PO SOLN: 30.0000 mL | ORAL | Status: DC | PRN

## 2014-03-10 MED ORDER — BENZOCAINE-MENTHOL 20-0.5 % EX AERO
1.0000 | INHALATION_SPRAY | CUTANEOUS | Status: DC | PRN
Start: 2014-03-10 — End: 2014-03-12
  Administered 2014-03-12: 1 via TOPICAL
  Filled 2014-03-10: qty 56

## 2014-03-10 MED ORDER — TERBUTALINE SULFATE 1 MG/ML IJ SOLN: 0.2500 mg | Freq: Once | INTRAMUSCULAR | Status: DC | PRN

## 2014-03-10 MED ORDER — ONDANSETRON HCL 4 MG/2ML IJ SOLN: 4.0000 mg | Freq: Four times a day (QID) | INTRAMUSCULAR | Status: DC | PRN

## 2014-03-10 MED ORDER — PRENATAL MULTIVITAMIN CH
1.0000 | ORAL_TABLET | Freq: Every day | ORAL | Status: DC
  Administered 2014-03-11: 1 via ORAL
  Filled 2014-03-10: qty 1

## 2014-03-10 MED ORDER — SENNOSIDES-DOCUSATE SODIUM 8.6-50 MG PO TABS
2.0000 | ORAL_TABLET | ORAL | Status: DC
  Administered 2014-03-10 – 2014-03-12 (×2): 2 via ORAL
  Filled 2014-03-10 (×2): qty 2

## 2014-03-10 MED ORDER — SODIUM CHLORIDE 0.9 % IJ SOLN: 3.0000 mL | INTRAMUSCULAR | Status: DC | PRN

## 2014-03-10 MED ORDER — IBUPROFEN 600 MG PO TABS
600.0000 mg | ORAL_TABLET | Freq: Four times a day (QID) | ORAL | Status: DC
  Administered 2014-03-10 – 2014-03-12 (×6): 600 mg via ORAL
  Filled 2014-03-10 (×6): qty 1

## 2014-03-10 MED ORDER — FENTANYL 2.5 MCG/ML BUPIVACAINE 1/10 % EPIDURAL INFUSION (WH - ANES)
INTRAMUSCULAR | Status: AC
  Administered 2014-03-10: 14 mL/h via EPIDURAL
  Filled 2014-03-10: qty 125

## 2014-03-10 MED ORDER — DIPHENHYDRAMINE HCL 25 MG PO CAPS: 25.0000 mg | ORAL_CAPSULE | Freq: Four times a day (QID) | ORAL | Status: DC | PRN

## 2014-03-10 MED ORDER — SIMETHICONE 80 MG PO CHEW
80.0000 mg | CHEWABLE_TABLET | ORAL | Status: DC | PRN
Start: 2014-03-10 — End: 2014-03-12

## 2014-03-10 MED ORDER — FENTANYL 2.5 MCG/ML BUPIVACAINE 1/10 % EPIDURAL INFUSION (WH - ANES)
14.0000 mL/h | INTRAMUSCULAR | Status: DC | PRN
  Administered 2014-03-10: 14 mL/h via EPIDURAL

## 2014-03-10 MED ORDER — OXYTOCIN BOLUS FROM INFUSION: 500.0000 mL | INTRAVENOUS | Status: DC

## 2014-03-10 MED ORDER — OXYTOCIN 40 UNITS IN LACTATED RINGERS INFUSION - SIMPLE MED: 62.5000 mL/h | INTRAVENOUS | Status: DC

## 2014-03-10 MED ORDER — LACTATED RINGERS IV SOLN: 500.0000 mL | INTRAVENOUS | Status: DC | PRN

## 2014-03-10 MED ORDER — DIPHENHYDRAMINE HCL 50 MG/ML IJ SOLN
12.5000 mg | INTRAMUSCULAR | Status: DC | PRN
  Filled 2014-03-10: qty 1

## 2014-03-10 MED ORDER — LACTATED RINGERS IV SOLN
INTRAVENOUS | Status: DC
  Administered 2014-03-10 (×2): via INTRAVENOUS

## 2014-03-10 MED ORDER — LANOLIN HYDROUS EX OINT: TOPICAL_OINTMENT | CUTANEOUS | Status: DC | PRN

## 2014-03-10 NOTE — Progress Notes (Signed)
Patient ID: Karen Morales, female   DOB: 03/04/1981, 33 y.o.   MRN: [id number] Now definite rom clear Cervix 4 cm 50 % fhr cat one History of genital herpes no prodromal sx's or active lesions noted  Typically on the left side.

## 2014-03-10 NOTE — Anesthesia Preprocedure Evaluation (Signed)

## 2014-03-10 NOTE — Anesthesia Procedure Notes (Signed)
Epidural Patient location during procedure: OB Start time: 03/10/2014 4:26 PM  Staffing Anesthesiologist: Kim Oki Performed by: anesthesiologist   Preanesthetic Checklist Completed: patient identified, site marked, surgical consent, pre-op evaluation, timeout performed, IV checked, risks and benefits discussed and monitors and equipment checked  Epidural Patient position: sitting Prep: site prepped and draped and DuraPrep Patient monitoring: continuous pulse ox and blood pressure Approach: midline Location: L3-L4 Injection technique: LOR air  Needle:  Needle type: Tuohy  Needle gauge: 17 G Needle length: 9 cm and 9 Needle insertion depth: 5 cm cm Catheter type: closed end flexible Catheter size: 19 Gauge Catheter at skin depth: 10 cm Test dose: negative  Assessment Events: blood not aspirated, injection not painful, no injection resistance, negative IV test and no paresthesia  Additional Notes Discussed risk of headache, infection, bleeding, nerve injury and failed or incomplete block.  Patient voices understanding and wishes to proceed.  Epidural placed easily on first attempt.  No paresthesia.  Patient tolerated procedure well with no apparent complications.  Jasmine DecemberA. Cheveyo Virginia, MDReason for block:procedure for pain

## 2014-03-10 NOTE — Progress Notes (Signed)
Patient ID: Karen Morales, female   DOB: 03/27/1981, 33 y.o.   MRN: [id number] Cervix 3 cm 50 % vtx -2  Unsure if arom completed cervix very postior fhr cat one

## 2014-03-10 NOTE — H&P (Signed)
Karen Morales is a 33 y.o. female presenting at 40 weeks for induction due to favorable cervix.  Positive GBS resistant to cleocin. Sensitive to vancomycin.  History of genital herpes no acitve lesions or prodromal sx's Maternal Medical History:  Fetal activity: Perceived fetal activity is normal.    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    OB History   Grav Para Term Preterm Abortions TAB SAB Ect Mult Living   3 2 2       2      Past Medical History  Diagnosis Date  . Medical history non-contributory   . HSV infection    Past Surgical History  Procedure Laterality Date  . No past surgeries    . Leep     Family History: family history is not on file. Social History:  reports that she has never smoked. She has never used smokeless tobacco. She reports that she does not drink alcohol or use illicit drugs.   Prenatal Transfer Tool  Maternal Diabetes: No Genetic Screening: Normal Maternal Ultrasounds/Referrals: Abnormal:  Findings:   Other: Fetal Ultrasounds or other Referrals:  None Maternal Substance Abuse:  No Significant Maternal Medications:  Meds include: Other:  Significant Maternal Lab Results:  Lab values include: Group B Strep positive Other Comments:  None  ROS    Blood pressure 114/78, pulse 86, temperature 98 F (36.7 C), temperature source Oral, resp. rate 20, height 5\' 3"  (1.6 m), weight 72.576 kg (160 lb), last menstrual period 06/03/2013. Maternal Exam:  Uterine Assessment: Contraction strength is mild.  Contraction frequency is irregular.   Abdomen: Patient reports no abdominal tenderness. Fundal height is c/w dates.   Estimated fetal weight is 7.   Fetal presentation: vertex  Pelvis: adequate for delivery.      Physical Exam  Prenatal labs: ABO, Rh: A/Positive/-- (12/19 0000) Antibody: Negative (12/19 0000) Rubella: Immune (12/19 0000) RPR: Nonreactive (12/19 0000)  HBsAg: Negative (12/19  0000)  HIV: Non-reactive (12/19 0000)  GBS: Positive (06/30 0000)   Assessment/Plan: IUP at term with favorable cervix Fetus with club foot Positive GBS Iv vancomycin routine labor and delivery   Karen Morales S 03/10/2014, 8:26 AM

## 2014-03-11 LAB — CBC
HEMATOCRIT: 37 % (ref 36.0–46.0)
HEMOGLOBIN: 12.6 g/dL (ref 12.0–15.0)
MCH: 32.2 pg (ref 26.0–34.0)
MCHC: 34.1 g/dL (ref 30.0–36.0)
MCV: 94.6 fL (ref 78.0–100.0)
Platelets: 185 10*3/uL (ref 150–400)
RBC: 3.91 MIL/uL (ref 3.87–5.11)
RDW: 14.3 % (ref 11.5–15.5)
WBC: 13.5 10*3/uL — AB (ref 4.0–10.5)

## 2014-03-11 NOTE — Lactation Note (Signed)
This note was copied from the chart of Karen Morales. Lactation Consultation Note  Patient Name: Karen Morales [id number]'s Date: 03/11/2014 Reason for consult: Initial assessment Initial visit to assess breastfeeding. Mother is an experienced breastfeeding mother and reports baby is feeding well for several feeding.Denies any concerns. Mom encouraged to feed baby 8-12 times/24 hours and with feeding cues. Mom made aware of O/P services, breastfeeding support groups, community resources, and our phone # for post-discharge questions.    Maternal Data Formula Feeding for Exclusion: No Does the patient have breastfeeding experience prior to this delivery?: Yes  Feeding Feeding Type: Breast Fed Length of feed: 15 min  LATCH Score/Interventions                      Lactation Tools Discussed/Used Pump Review: Milk Storage (reviewed use, mother obtaining a DEBP from lactation office  as part of the maternity benefit) Date initiated:: 03/11/14   Consult Status      Christella HartiganDaly, Jermane Brayboy M 03/11/2014, 3:26 PM

## 2014-03-11 NOTE — Progress Notes (Signed)
Post Partum Day 1 Subjective: no complaints, up ad lib, voiding, tolerating PO and + flatus  Objective: Blood pressure 98/51, pulse 63, temperature 97.9 F (36.6 C), temperature source Oral, resp. rate 18, height 5\' 3"  (1.6 m), weight 160 lb (72.576 kg), last menstrual period 06/03/2013, SpO2 98.00%, unknown if currently breastfeeding.  Physical Exam:  General: alert and cooperative Lochia: appropriate Uterine Fundus: firm Incision: perineum intact DVT Evaluation: No evidence of DVT seen on physical exam. Negative Homan's sign. No cords or calf tenderness. No significant calf/ankle edema.   Recent Labs  03/10/14 0800 03/11/14 0605  HGB 13.8 12.6  HCT 40.1 37.0    Assessment/Plan: Plan for discharge tomorrow and Circumcision prior to discharge   LOS: 1 day   Bashir Marchetti G 03/11/2014, 7:52 AM

## 2014-03-11 NOTE — Anesthesia Postprocedure Evaluation (Signed)
  Anesthesia Post-op Note  Patient: Karen Morales  Procedure(s) Performed: * No procedures listed *  Patient Location: Mother/Baby  Anesthesia Type:Epidural  Level of Consciousness: awake, alert , oriented and patient cooperative  Airway and Oxygen Therapy: Patient Spontanous Breathing  Post-op Pain: mild  Post-op Assessment: Patient's Cardiovascular Status Stable, Respiratory Function Stable, No headache, No backache, No residual numbness and No residual motor weakness  Post-op Vital Signs: stable  Last Vitals:  Filed Vitals:   03/11/14 0029  BP: 98/51  Pulse: 63  Temp: 36.6 C  Resp: 18    Complications: No apparent anesthesia complications

## 2014-03-12 MED ORDER — IBUPROFEN 600 MG PO TABS: 600.0000 mg | ORAL_TABLET | Freq: Four times a day (QID) | ORAL | Status: DC

## 2014-03-12 NOTE — Discharge Summary (Signed)
Obstetric Discharge Summary Reason for Admission: induction of labor Prenatal Procedures: ultrasound Intrapartum Procedures: spontaneous vaginal delivery Postpartum Procedures: none Complications-Operative and Postpartum: 2 degree perineal laceration Hemoglobin  Date Value Ref Range Status  03/11/2014 12.6  12.0 - 15.0 g/dL Final     HCT  Date Value Ref Range Status  03/11/2014 37.0  36.0 - 46.0 % Final    Physical Exam:  General: alert and cooperative Lochia: appropriate Uterine Fundus: firm Incision: perineum intact DVT Evaluation: No evidence of DVT seen on physical exam. Negative Homan's sign. No cords or calf tenderness. No significant calf/ankle edema.  Discharge Diagnoses: Term Pregnancy-delivered  Discharge Information: Date: 03/12/2014 Activity: pelvic rest Diet: routine Medications: PNV and Ibuprofen Condition: stable Instructions: refer to practice specific booklet Discharge to: home   Newborn Data: Live born female  Birth Weight: 7 lb 9.7 oz (3450 g) APGAR: 9, 9  Home with mother.  Hong Timm G 03/12/2014, 8:04 AM

## 2014-06-14 ENCOUNTER — Encounter (HOSPITAL_COMMUNITY): Payer: Self-pay

## 2015-09-27 DIAGNOSIS — H5213 Myopia, bilateral: Secondary | ICD-10-CM | POA: Diagnosis not present

## 2016-02-28 ENCOUNTER — Ambulatory Visit (INDEPENDENT_AMBULATORY_CARE_PROVIDER_SITE_OTHER): Payer: 59 | Admitting: Family Medicine

## 2016-02-28 ENCOUNTER — Encounter: Payer: Self-pay | Admitting: Family Medicine

## 2016-02-28 ENCOUNTER — Encounter (INDEPENDENT_AMBULATORY_CARE_PROVIDER_SITE_OTHER): Payer: Self-pay

## 2016-02-28 VITALS — BP 111/77 | HR 79 | Ht 62.0 in | Wt 133.0 lb

## 2016-02-28 DIAGNOSIS — M62838 Other muscle spasm: Secondary | ICD-10-CM

## 2016-02-28 DIAGNOSIS — M6248 Contracture of muscle, other site: Secondary | ICD-10-CM | POA: Diagnosis not present

## 2016-02-28 NOTE — Patient Instructions (Signed)
Thank you for coming in,   Please follow up with us in 3-4 weeks if there is no improvement.     Please feel free to call with any questions or concerns at any time, at [phone]. --Dr. Jordan LikesSchmitz

## 2016-02-28 NOTE — Progress Notes (Signed)
  Karen CruiseValerie A Morales - 35 y.o. female MRN [id number]  Date of birth: 02/01/1981  SUBJECTIVE:  Including CC & ROS.  Chief Complaint  Patient presents with  . Neck Pain    Left trapezius pain:  She has ongoing acute on chronic left trapezius pain.  It is usually on her right side but is now on her left side.  It presented after she woke up one morning.  Pain has been occuring, intermittently for 4-5 weeks.  She has been to a chiropractor with minimal relief.  This pain never completely goes away.  She has a 34 year old at home.  No specific exacerbating factors.  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  Pain seems to be improving and localized.  Has had massages about once a month which help but aren't enough.  Has taken ibuprofen for headache but nothing regularly.  Has some numbness in her three fingers of each hand.   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  ROS: No unexpected weight loss, feer, chills, swelling, instability, muscle pain, redness, otherwise see HPI   HISTORY: Past Medical, Surgical, Social, and Family History Reviewed & Updated per EMR.   Pertinent Historical Findings include: PMSHx -  History of two vaginal births  PSHx -  No tobacco, Occasional alcohol use. Part time in [employer]  FHx -  Paternal grandmother with cancer. HTN, HLD.  No rheumatologic runs in the family.  Medications - none  DATA REVIEWED: None to review   PHYSICAL EXAM:  VS: BP:111/77 mmHg  HR:79bpm  TEMP: ( )  RESP:   HT:5\' 2"  (157.5 cm)   WT:133 lb (60.328 kg)  BMI:24.4 PHYSICAL EXAM: Gen: NAD, alert, cooperative with exam, well-appearing HEENT: clear conjunctiva,  CV:  no edema, capillary refill brisk, normal rate Resp: non-labored Skin: no rashes, normal turgor  Neuro: no gross deficits.  Psych:  alert and oriented Neck: Left trapezius and shoulder are held higher than  right side. No palpable stepoffs. Negative Spurling's maneuver. Pain with right lateral bending and rotation.  Grip strength and sensation normal in bilateral hands Strength good C4 to T1 distribution No sensory change to C4 to T1 Reflexes normal Shoulder: Normal inspection Palpation is normal with no tenderness over AC joint or bicipital groove. ROM is full in all planes. Rotator cuff strength normal throughout. No signs of impingement with negative Neer and Hawkin's tests, empty can sign.. Normal scapular function observed. No painful arc and no drop arm sign.  ASSESSMENT & PLAN:   Trapezius muscle spasm Most likely she has a spasm in her trapezius muscle.  Scapular function and shoulder exam were reassuring.  Has some limitations in the neck associated with left trapezius.  Doesn't appear to have scoliosis but reports history of low back pain during gymnastics.  No history of personal or family history of hypermobility  - refer to PT for eval and treat  - ibuprofen PRN  - can try muscle rub/massages  - if no improvement, then follow up in 3-4 weeks and may consider cervical vs thoracic vs lumbar x-rays looking for scoliosis. Possible for trigger point injection vs muscle relaxer.

## 2016-02-29 ENCOUNTER — Ambulatory Visit: Payer: 59 | Attending: Family Medicine | Admitting: Physical Therapy

## 2016-02-29 DIAGNOSIS — M62838 Other muscle spasm: Secondary | ICD-10-CM | POA: Diagnosis not present

## 2016-02-29 DIAGNOSIS — R293 Abnormal posture: Secondary | ICD-10-CM | POA: Insufficient documentation

## 2016-02-29 DIAGNOSIS — M542 Cervicalgia: Secondary | ICD-10-CM | POA: Diagnosis not present

## 2016-02-29 NOTE — Therapy (Signed)
[facility] [address] [place], [place], [address] Phone: [phone]   Fax:  [phone]  Physical Therapy Evaluation  Patient Details  Name: Karen Morales MRN: [id number] Date of Birth: 12-15-1980 Referring Provider: Ralene Cork MD  Encounter Date: 02/29/2016      PT End of Session - 02/29/16 1243    Visit Number 1   Number of Visits 13   Date for PT Re-Evaluation 04/11/16   Authorization Type [facility]   PT Start Time 1100   PT Stop Time 1158   PT Time Calculation (min) 58 min   Activity Tolerance Patient tolerated treatment well   Behavior During Therapy [facility] for tasks assessed/performed      Past Medical History  Diagnosis Date  . Medical history non-contributory   . HSV infection     Past Surgical History  Procedure Laterality Date  . No past surgeries    . Leep      There were no vitals filed for this visit.       Subjective Assessment - 02/29/16 1108    Subjective pt is a 35 y.o F with CC of chronic neck pain and bil upper trap tightness thats been present since she was 35 y.o when she was in gymnastics landing on her R shoulder. recent exacerbation 5 weeks with no reprot of specific mechanism, reports intermittent fluctuating tightness in L/ R upper trap tightness with intermittent headahces when it flares up.    How long can you sit comfortably? unlimited   How long can you stand comfortably? unlimited   How long can you walk comfortably? unlimited   Diagnostic tests none   Patient Stated Goals decrease pain, improve mobility   Currently in Pain? Yes   Pain Score 0-No pain  tightness at end range of cervical mobilty    Pain Location Neck   Pain Orientation Left;Right   Pain Type Chronic pain   Pain Onset More than a month ago   Pain Frequency Intermittent   Aggravating Factors  R/ L rotation, side bending, prolonged lifting and carrying (child)    Pain Relieving Factors stretching, MHP              [facility] PT Assessment - 02/29/16 1116    Assessment   Medical Diagnosis neck pain and trapezius tightness   Referring Provider Ralene Cork MD   Onset Date/Surgical Date --  17 years ago   Hand Dominance Right   Next MD Visit 03/27/2016   Prior Therapy yes   Precautions   Precautions None   Restrictions   Weight Bearing Restrictions No   Balance Screen   Has the patient fallen in the past 6 months No   Has the patient had a decrease in activity level because of a fear of falling?  No   Is the patient reluctant to leave their home because of a fear of falling?  No   Home Environment   Living Environment Private residence   Living Arrangements Spouse/significant other;Children   Available Help at Discharge Available PRN/intermittently   Type of Home House   Home Access Stairs to enter   Entrance Stairs-Number of Steps 3   Entrance Stairs-Rails Can reach both   Home Layout Two level   Alternate Level Stairs-Number of Steps 15   Alternate Level Stairs-Rails Right   Prior Function   Level of Independence Independent with basic ADLs;Independent   Vocation Part time employment  PTA   Vocation  Requirements lifting, carrying, moving lifting, squating, rubbing   Cognition   Overall Cognitive Status Within Functional Limits for tasks assessed   Observation/Other Assessments   Focus on Therapeutic Outcomes (FOTO)  31% limited  24% limited   Posture/Postural Control   Posture/Postural Control Postural limitations   Postural Limitations Rounded Shoulders;Forward head   ROM / Strength   AROM / PROM / Strength AROM;PROM;Strength   AROM   AROM Assessment Site Cervical   Cervical Flexion 48   Cervical Extension 52   Cervical - Right Side Bend 28  tightness at end range   Cervical - Left Side Bend 40   Cervical - Right Rotation 45   Cervical - Left Rotation 55   PROM   Overall PROM  Within functional limits for tasks performed   Strength   Overall Strength  Comments all other muscles WNL   Strength Assessment Site Shoulder   Right/Left Shoulder Right   Right Shoulder External Rotation 4/5   Left Shoulder External Rotation 4/5   Palpation   Spinal mobility Hypomobility of T1-T7 with central PAIVM   Palpation comment winging on bil scapulas with R >L. tightess of bil upper traps / levator scapuale with multiple trigger points.                    [facility] Adult PT Treatment/Exercise - 02/29/16 1116    Modalities   Modalities Moist Heat   Moist Heat Therapy   Number Minutes Moist Heat 10 Minutes   Moist Heat Location Cervical;Shoulder  along upper traps   Manual Therapy   Manual Therapy Joint mobilization;Soft tissue mobilization;Myofascial release   Joint Mobilization grade 2 T1-T7 central thoracic mobs 2 x 15 oscillations   Soft tissue mobilization IASTM along bil upper traps and levator scapulae   Myofascial Release fascial stretching/ rolloing of bil upper traps and levator scapulae          Trigger Point Dry Needling - 02/29/16 1306    Consent Given? Yes   Education Handout Provided Yes   Muscles Treated Upper Body Upper trapezius   Upper Trapezius Response Twitch reponse elicited;Palpable increased muscle length  x 2 each with pistoning technique              PT Education - 02/29/16 1242    Education provided Yes   Education Details evaluation findings, POC, goals, HEP with form/ rationale. Dry needling education benefits, what to expect and after care   Person(s) Educated Patient   Methods Explanation;Verbal cues   Comprehension Verbalized understanding;Verbal cues required          PT Short Term Goals - 02/29/16 1256    PT SHORT TERM GOAL #1   Title pt will be I with initial (03/21/2016)   Time 3   Period Weeks   Status New           PT Long Term Goals - 02/29/16 1257    PT LONG TERM GOAL #1   Title pt will be I with all HEP as of last visit (04/11/2016)   Time 6   Period Weeks   Status New    PT LONG TERM GOAL #2   Title She will demonstrate decreased spasm in bil upper traps to reduce pain to </= 1/10 with cervical mobility and lifting/ carrying activities (04/11/2016)   Time 6   Period Weeks   Status New   PT LONG TERM GOAL #3   Title she will improve R sidebending / and  rotation by >/= 10 degrees and L rotation/ sidebending by >/= 5 degrees with </= 1/10 to assist with ADLs and safety during driving (1/61/0960)   Time 6   Period Weeks   Status New   PT LONG TERM GOAL #4   Title She will be able to lift and carry >/= 20#  at waist/ shoulder height with </= 1/10 pain to assist with funcitonal lifting and carrying activities as well as taking care of and holding her kids (04/11/2016)   Time 6   Period Weeks   Status New   PT LONG TERM GOAL #5   Title FOTO score will improve foto score to </= 24% limited to demonstate improvement in function at discharge (04/11/2016)   Time 6   Period Weeks   Status New               Plan - 02/29/16 1244    Clinical Impression Statement Mrs. Mezo presents to OPPT as a low complexity evaluation with CC of chronic neck pain and bil upper trapezius spasm. limited cervical rotation to the R and limited Sidebending to the L seconary to tightness and pain. palpatoin revealed significant tightness in bil upper traps/ levator scapulae with mulitple trigger points. hypomobility of upper thoraicc spine with scapular winigng bil.  she would benefit from physical therapy to decrase muslce tightness, reduce shoulder pain, improve cervical mobility and return pt to her PLOF by addressing the deficits listed.    Rehab Potential Good   PT Frequency 2x / week   PT Duration 6 weeks   PT Treatment/Interventions ADLs/Self Care Home Management;Electrical Stimulation;Iontophoresis /ml Dexamethasone;Moist Heat;Therapeutic exercise;Therapeutic activities;Manual techniques;Taping;Dry needling;Patient/family education;Ultrasound   PT Next Visit Plan  assess review HEP, assess response to DN, manual, stretching, thoracic mobs, modalities    PT Home Exercise Plan thoracic extension over chair, upper trap stretch, Rows, and ceiling punches.    Consulted and Agree with Plan of Care Patient      Patient will benefit from skilled therapeutic intervention in order to improve the following deficits and impairments:  Pain, Hypomobility, Increased fascial restricitons, Impaired flexibility, Improper body mechanics, Postural dysfunction, Decreased endurance, Decreased activity tolerance  Visit Diagnosis: Cervicalgia - Plan: PT plan of care cert/re-cert  Other muscle spasm - Plan: PT plan of care cert/re-cert  Abnormal posture - Plan: PT plan of care cert/re-cert     Problem List Patient Active Problem List   Diagnosis Date Noted  . Trapezius muscle spasm 02/28/2016  . Normal pregnancy in multigravida in third trimester 03/10/2014  . Spontaneous vaginal delivery 03/10/2014    Class: Status post  . Other known or suspected fetal abnormality, not elsewhere classified, affecting management of mother, antepartum condition or complication 10/14/2013   Lulu Riding PT, DPT, LAT, ATC  02/29/2016  1:11 PM      [facility] [address] [place], [place], [address] Phone: [phone]   Fax:  [phone]  Name: Karen Morales MRN: [id number] Date of Birth: 1980-12-05

## 2016-02-29 NOTE — Assessment & Plan Note (Signed)
Most likely she has a spasm in her trapezius muscle.  Scapular function and shoulder exam were reassuring.  Has some limitations in the neck associated with left trapezius.  Doesn't appear to have scoliosis but reports history of low back pain during gymnastics.  No history of personal or family history of hypermobility  - refer to PT for eval and treat  - ibuprofen PRN  - can try muscle rub/massages  - if no improvement, then follow up in 3-4 weeks and may consider cervical vs thoracic vs lumbar x-rays looking for scoliosis. Possible for trigger point injection vs muscle relaxer.

## 2016-03-01 ENCOUNTER — Ambulatory Visit: Payer: 59 | Admitting: Physical Therapy

## 2016-03-05 ENCOUNTER — Ambulatory Visit: Payer: 59 | Admitting: Physical Therapy

## 2016-03-05 DIAGNOSIS — R293 Abnormal posture: Secondary | ICD-10-CM

## 2016-03-05 DIAGNOSIS — M542 Cervicalgia: Secondary | ICD-10-CM | POA: Diagnosis not present

## 2016-03-05 DIAGNOSIS — M62838 Other muscle spasm: Secondary | ICD-10-CM

## 2016-03-05 NOTE — Therapy (Signed)
[facility] [address] [place], [place], [address] Phone: [phone]   Fax:  [phone]  Physical Therapy Treatment  Patient Details  Name: Karen Morales MRN: [id number] Date of Birth: December 07, 1980 Referring Provider: Ralene Cork MD  Encounter Date: 03/05/2016      PT End of Session - 03/05/16 1246    Visit Number 2   Number of Visits 13   Date for PT Re-Evaluation 04/11/16   Authorization Type [facility]   PT Start Time 1150   PT Stop Time 1250   PT Time Calculation (min) 60 min   Activity Tolerance Patient tolerated treatment well   Behavior During Therapy [facility] for tasks assessed/performed      Past Medical History:  Diagnosis Date  . HSV infection   . Medical history non-contributory     Past Surgical History:  Procedure Laterality Date  . LEEP    . NO PAST SURGERIES      There were no vitals filed for this visit.      Subjective Assessment - 03/05/16 1159    Subjective "I was sore for following the DN, did get a massage which really helped"                         [facility] Adult PT Treatment/Exercise - 03/05/16 1243      Neck Exercises: Supine   Other Supine Exercise thoracic extension over bolster 2 x 15  with hands behind head and elbows adducted     Modalities   Modalities Moist Heat     Moist Heat Therapy   Number Minutes Moist Heat 10 Minutes   Moist Heat Location Cervical;Shoulder     Manual Therapy   Manual Therapy Taping   Joint Mobilization grade 2 C3-C5 with central P>A and lateral gapping performed at C3 bil   Soft tissue mobilization IASTM along bil upper traps and levator scapulae   Myofascial Release fascial stretching/ rolloing of bil upper traps and levator scapulae   McConnell L upper trap inhibition taping trial  reported decreaased pain with L rotation     Neck Exercises: Stretches   Upper Trapezius Stretch 2 reps;30 seconds          Trigger  Point Dry Needling - 03/05/16 1200    Consent Given? Yes   Education Handout Provided No   Muscles Treated Upper Body Upper trapezius   Upper Trapezius Response Twitch reponse elicited;Palpable increased muscle length  x3 bil with pistoning              PT Education - 03/05/16 1245    Education provided Yes   Education Details proper thoracic extension over bolster avoid torquing on the neck   Person(s) Educated Patient   Methods Explanation;Demonstration   Comprehension Verbalized understanding;Returned demonstration          PT Short Term Goals - 02/29/16 1256      PT SHORT TERM GOAL #1   Title pt will be I with initial (03/21/2016)   Time 3   Period Weeks   Status New           PT Long Term Goals - 02/29/16 1257      PT LONG TERM GOAL #1   Title pt will be I with all HEP as of last visit (04/11/2016)   Time 6   Period Weeks   Status New     PT LONG TERM GOAL #2  Title She will demonstrate decreased spasm in bil upper traps to reduce pain to </= 1/10 with cervical mobility and lifting/ carrying activities (04/11/2016)   Time 6   Period Weeks   Status New     PT LONG TERM GOAL #3   Title she will improve R sidebending / and rotation by >/= 10 degrees and L rotation/ sidebending by >/= 5 degrees with </= 1/10 to assist with ADLs and safety during driving (1/91/4782)   Time 6   Period Weeks   Status New     PT LONG TERM GOAL #4   Title She will be able to lift and carry >/= 20#  at waist/ shoulder height with </= 1/10 pain to assist with funcitonal lifting and carrying activities as well as taking care of and holding her kids (04/11/2016)   Time 6   Period Weeks   Status New     PT LONG TERM GOAL #5   Title FOTO score will improve foto score to </= 24% limited to demonstate improvement in function at discharge (04/11/2016)   Time 6   Period Weeks   Status New               Plan - 03/05/16 1247    Clinical Impression Statement Karen Morales  reports decreased tightness in bil upper traps but continues to demonstrate soreness. DN was performed on bil upper traps x3 ea; pt was monitored throughout treatment. Soft tissue work was performed following DN. Trialed L upper trap inhibition taping which she reported decreased pain.    PT Next Visit Plan assess response to DN/ upper trap inhibition taping manual, stretching, thoracic mobs, modalities, scapular stabilizers   Consulted and Agree with Plan of Care Patient      Patient will benefit from skilled therapeutic intervention in order to improve the following deficits and impairments:     Visit Diagnosis: Cervicalgia  Other muscle spasm  Abnormal posture     Problem List Patient Active Problem List   Diagnosis Date Noted  . Trapezius muscle spasm 02/28/2016  . Normal pregnancy in multigravida in third trimester 03/10/2014  . Spontaneous vaginal delivery 03/10/2014    Class: Status post  . Other known or suspected fetal abnormality, not elsewhere classified, affecting management of mother, antepartum condition or complication 10/14/2013    Karen Morales PT, DPT, LAT, ATC  03/05/16  12:50 PM       [facility] [address] [place], [place], [address] Phone: [phone]   Fax:  [phone]  Name: Karen Morales MRN: [id number] Date of Birth: 05-09-1981

## 2016-03-06 ENCOUNTER — Encounter: Payer: 59 | Admitting: Physical Therapy

## 2016-03-08 ENCOUNTER — Ambulatory Visit: Payer: 59 | Admitting: Physical Therapy

## 2016-03-08 DIAGNOSIS — M542 Cervicalgia: Secondary | ICD-10-CM

## 2016-03-08 DIAGNOSIS — M62838 Other muscle spasm: Secondary | ICD-10-CM

## 2016-03-08 DIAGNOSIS — R293 Abnormal posture: Secondary | ICD-10-CM | POA: Diagnosis not present

## 2016-03-08 NOTE — Therapy (Signed)
[facility] [address] [place], [place], [address] Phone: [phone]   Fax:  [phone]  Physical Therapy Treatment  Patient Details  Name: ISAMARA ALMER MRN: [id number] Date of Birth: [birth year] Referring Provider: Ralene Cork MD  Encounter Date: 03/08/2016      PT End of Session - 03/08/16 1256    Visit Number 3   Number of Visits 13   Date for PT Re-Evaluation 04/11/16   PT Start Time 1148   PT Stop Time 1238   PT Time Calculation (min) 50 min   Activity Tolerance Patient tolerated treatment well   Behavior During Therapy [facility] for tasks assessed/performed      Past Medical History:  Diagnosis Date  . HSV infection   . Medical history non-contributory     Past Surgical History:  Procedure Laterality Date  . LEEP    . NO PAST SURGERIES      There were no vitals filed for this visit.      Subjective Assessment - 03/08/16 1156    Subjective " The tape really helped it calmed it down a lot, I did get some tingling in the front of the shoulder"    Currently in Pain? Yes   Pain Score 0-No pain   Pain Location Neck   Pain Orientation Right;Left   Pain Descriptors / Indicators Tightness   Pain Type Chronic pain   Pain Onset More than a month ago   Pain Frequency Intermittent                         [facility] Adult PT Treatment/Exercise - 03/08/16 0001      Manual Therapy   Soft tissue mobilization IASTM along bil upper traps and levator scapulae, and bil sub-occipitals   Myofascial Release fascial stretching/ rolloing of bil upper traps and levator scapulae   McConnell L upper trap inhibition taping bil          Trigger Point Dry Needling - 03/08/16 1258    Consent Given? Yes   Education Handout Provided No   Muscles Treated Upper Body Upper trapezius;Suboccipitals muscle group   Upper Trapezius Response Twitch reponse elicited;Palpable increased muscle length  x 3 performed  bil   SubOccipitals Response Twitch response elicited;Palpable increased muscle length  x 1 performed bil              PT Education - 03/08/16 1255    Education provided Yes   Education Details chin tucks and using tennis balls for sub-occipital release.    Person(s) Educated Patient   Methods Explanation;Verbal cues   Comprehension Verbalized understanding;Verbal cues required          PT Short Term Goals - 03/08/16 1257      PT SHORT TERM GOAL #1   Title pt will be I with initial (03/21/2016)   Time 3   Period Weeks   Status Achieved           PT Long Term Goals - 03/08/16 1258      PT LONG TERM GOAL #1   Title pt will be I with all HEP as of last visit (04/11/2016)   Time 6   Period Weeks   Status On-going     PT LONG TERM GOAL #2   Title She will demonstrate decreased spasm in bil upper traps to reduce pain to </= 1/10 with cervical mobility and lifting/ carrying activities (04/11/2016)   Time 6  Period Weeks   Status On-going     PT LONG TERM GOAL #3   Title she will improve R sidebending / and rotation by >/= 10 degrees and L rotation/ sidebending by >/= 5 degrees with </= 1/10 to assist with ADLs and safety during driving (1/61/0960)   Time 6   Period Weeks   Status On-going     PT LONG TERM GOAL #4   Title She will be able to lift and carry >/= 20#  at waist/ shoulder height with </= 1/10 pain to assist with funcitonal lifting and carrying activities as well as taking care of and holding her kids (04/11/2016)   Time 6   Period Weeks   Status On-going     PT LONG TERM GOAL #5   Title FOTO score will improve foto score to </= 24% limited to demonstate improvement in function at discharge (04/11/2016)   Time 6   Period Weeks   Status Unable to assess               Plan - 03/08/16 1256    Clinical Impression Statement Mrs. Gains continues to report tightness in the bil upper traps but reports decreased soreness. The inhibition taping  made significant relief. DN was performed on bil upper traps/ sub-occiptials; pt moniotred throughout treatment. applied inhibition taping bil.    PT Next Visit Plan assess response to DN/ upper trap inhibition taping manual, stretching, thoracic mobs, modalities, scapular stabilizers   Consulted and Agree with Plan of Care Patient      Patient will benefit from skilled therapeutic intervention in order to improve the following deficits and impairments:  Pain, Hypomobility, Increased fascial restricitons, Impaired flexibility, Improper body mechanics, Postural dysfunction, Decreased endurance, Decreased activity tolerance  Visit Diagnosis: Cervicalgia  Other muscle spasm  Abnormal posture     Problem List Patient Active Problem List   Diagnosis Date Noted  . Trapezius muscle spasm 02/28/2016  . Normal pregnancy in multigravida in third trimester 03/10/2014  . Spontaneous vaginal delivery 03/10/2014    Class: Status post  . Other known or suspected fetal abnormality, not elsewhere classified, affecting management of mother, antepartum condition or complication 10/14/2013   Lulu Riding PT, DPT, LAT, ATC  03/08/16  1:01 PM      [facility] [address] [place], [place], [address] Phone: [phone]   Fax:  [phone]  Name: JOSELYNNE KILLAM MRN: [id number] Date of Birth: 10/21/1980

## 2016-03-19 ENCOUNTER — Ambulatory Visit: Payer: 59 | Attending: Family Medicine | Admitting: Physical Therapy

## 2016-03-19 DIAGNOSIS — R293 Abnormal posture: Secondary | ICD-10-CM | POA: Diagnosis not present

## 2016-03-19 DIAGNOSIS — M542 Cervicalgia: Secondary | ICD-10-CM | POA: Insufficient documentation

## 2016-03-19 DIAGNOSIS — M62838 Other muscle spasm: Secondary | ICD-10-CM | POA: Diagnosis not present

## 2016-03-19 NOTE — Therapy (Signed)
[facility] [address] [place], [place], [address] Phone: [phone]   Fax:  [phone]  Physical Therapy Treatment  Patient Details  Name: Karen Morales MRN: [id number] Date of Birth: 08/04/1981 Referring Provider: Ralene Corkimothy R Draper MD  Encounter Date: 03/19/2016      PT End of Session - 03/19/16 1246    Visit Number 4   Number of Visits 13   Date for PT Re-Evaluation 04/11/16   PT Start Time 1146   PT Stop Time 1235   PT Time Calculation (min) 49 min   Activity Tolerance Patient tolerated treatment well   Behavior During Therapy [facility] for tasks assessed/performed      Past Medical History:  Diagnosis Date  . HSV infection   . Medical history non-contributory     Past Surgical History:  Procedure Laterality Date  . LEEP    . NO PAST SURGERIES      There were no vitals filed for this visit.      Subjective Assessment - 03/19/16 1150    Subjective "the neck is doing better but I am still getting tingling down the L arm to the elbow"    Currently in Pain? No/denies                         [facility] - 03/19/16 0001      Neck Exercises: Standing   Neck Retraction 10 reps   Other Standing Exercises Horizontal abduction with flexion/ extension 2 x 10 squeezing bolster between shoulder blades  using green theraband     Shoulder Exercises: Prone   Other Prone Exercises I's T's Y's 2 x 10 with 1#      Moist Heat Therapy   Number Minutes Moist Heat 10 Minutes   Moist Heat Location Shoulder     Manual Therapy   Joint Mobilization grade 3 T1-T8 Thoracic mobs   Soft tissue mobilization IASTM along the teres minor/ major along the axillary border.           Trigger Point Dry Needling - 03/19/16 1241    Muscles Treated Upper Body Subscapularis  Teres minor/ major   Subscapularis Response Twitch response elicited;Palpable increased muscle length  teres  minor/major x 2 each              PT Education - 03/19/16 1245    Education provided Yes   Education Details updated HEP for scapular stabilizers   Person(s) Educated Patient   Methods Explanation;Verbal cues   Comprehension Verbalized understanding;Verbal cues required          PT Short Term Goals - 03/08/16 1257      PT SHORT TERM GOAL #1   Title pt will be I with initial (03/21/2016)   Time 3   Period Weeks   Status Achieved           PT Long Term Goals - 03/08/16 1258      PT LONG TERM GOAL #1   Title pt will be I with all HEP as of last visit (04/11/2016)   Time 6   Period Weeks   Status On-going     PT LONG TERM GOAL #2   Title She will demonstrate decreased spasm in bil upper traps to reduce pain to </= 1/10 with cervical mobility and lifting/ carrying activities (04/11/2016)   Time 6   Period Weeks   Status On-going     PT LONG  TERM GOAL #3   Title she will improve R sidebending / and rotation by >/= 10 degrees and L rotation/ sidebending by >/= 5 degrees with </= 1/10 to assist with ADLs and safety during driving (0/98/1191)   Time 6   Period Weeks   Status On-going     PT LONG TERM GOAL #4   Title She will be able to lift and carry >/= 20#  at waist/ shoulder height with </= 1/10 pain to assist with funcitonal lifting and carrying activities as well as taking care of and holding her kids (04/11/2016)   Time 6   Period Weeks   Status On-going     PT LONG TERM GOAL #5   Title FOTO score will improve foto score to </= 24% limited to demonstate improvement in function at discharge (04/11/2016)   Time 6   Period Weeks   Status Unable to assess               Plan - 03/19/16 1246    Clinical Impression Statement Karen Morales demonstrates improvement with decreased pain in the upper traps but reports pain tightness in teres major with referral to elbow. DN was peformed on sub-scapularis, and teres minor/ major; pt monitored throughout treatment.  follow soft tissue work was performed to calm down soreness. she was able to perform scapular stabilizer exercises with minimal report of soreness.    PT Next Visit Plan assess response to DN/ stretching, thoracic mobs, modalities, scapular stabilizers, scapular mobs (upward rotation), ROM/ goals   PT Home Exercise Plan I's T's Y's,    Consulted and Agree with Plan of Care Patient      Patient will benefit from skilled therapeutic intervention in order to improve the following deficits and impairments:  Pain, Hypomobility, Increased fascial restricitons, Impaired flexibility, Improper body mechanics, Postural dysfunction, Decreased endurance, Decreased activity tolerance  Visit Diagnosis: Cervicalgia  Other muscle spasm  Abnormal posture     Problem List Patient Active Problem List   Diagnosis Date Noted  . Trapezius muscle spasm 02/28/2016  . Normal pregnancy in multigravida in third trimester 03/10/2014  . Spontaneous vaginal delivery 03/10/2014    Class: Status post  . Other known or suspected fetal abnormality, not elsewhere classified, affecting management of mother, antepartum condition or complication 10/14/2013   Karen Morales PT, DPT, LAT, ATC  03/19/16  12:53 PM      [facility] [address] [place], [place], [address] Phone: [phone]   Fax:  [phone]  Name: Karen Morales MRN: [id number] Date of Birth: Mar 24, 1981

## 2016-03-21 ENCOUNTER — Ambulatory Visit: Payer: 59 | Admitting: Physical Therapy

## 2016-03-26 ENCOUNTER — Ambulatory Visit: Payer: 59 | Admitting: Physical Therapy

## 2016-03-27 ENCOUNTER — Ambulatory Visit: Payer: 59 | Admitting: Family Medicine

## 2016-03-29 ENCOUNTER — Ambulatory Visit: Payer: 59 | Admitting: Physical Therapy

## 2016-03-29 DIAGNOSIS — M62838 Other muscle spasm: Secondary | ICD-10-CM

## 2016-03-29 DIAGNOSIS — M542 Cervicalgia: Secondary | ICD-10-CM

## 2016-03-29 DIAGNOSIS — R293 Abnormal posture: Secondary | ICD-10-CM

## 2016-03-29 NOTE — Therapy (Addendum)
[place], [place], [address] Phone: [phone]   Fax:  [phone]  Physical Therapy Treatment / Discharge Note  Patient Details  Name: Karen Morales MRN: [id number] Date of Birth: May 02, 1981 Referring Provider: Thurman Coyer MD  Encounter Date: 03/29/2016      PT End of Session - 03/29/16 1159    Visit Number 5   Number of Visits 13   Date for PT Re-Evaluation 04/11/16   PT Start Time 8315   PT Stop Time 1248   PT Time Calculation (min) 50 min   Activity Tolerance Patient tolerated treatment well   Behavior During Therapy [facility] for tasks assessed/performed      Past Medical History:  Diagnosis Date  . HSV infection   . Medical history non-contributory     Past Surgical History:  Procedure Laterality Date  . LEEP    . NO PAST SURGERIES      There were no vitals filed for this visit.      Subjective Assessment - 03/29/16 1200    Subjective "the tingling has improved alot since the DN but it took a couple of days to calm down, still haveing it go down to the left elbow especially with Laying on the L side"    Currently in Pain? Yes   Pain Score 0-No pain   Pain Orientation Left   Pain Type Chronic pain   Pain Onset More than a month ago   Pain Frequency Intermittent   Aggravating Factors  prolong sitting and L sidelying   Pain Relieving Factors stretching, MHP             [facility] PT Assessment - 03/29/16 1203      AROM   Cervical Flexion 70   Cervical Extension 58   Cervical - Right Side Bend 40  end range tightness with minimal soreness   Cervical - Left Side Bend 48   Cervical - Right Rotation 65   Cervical - Left Rotation 65     Strength   Right Shoulder External Rotation 4+/5   Left Shoulder External Rotation 4+/5     Special Tests    Special Tests Thoracic Outlet Syndrome   Thoracic Outlet Syndrome  Carmelina Paddock Test     Adson Test   Findings Positive    Side  Left     Allen Test   Findings Positive   Side  Left                     [facility] Adult PT Treatment/Exercise - 03/29/16 0001      Manual Therapy   Joint Mobilization Grade 3 L 1st rib inferior mobs   pt in supine   Soft tissue mobilization IASTM along the Scalene on the L only   Myofascial Release gentle fascial stretching of the L upper scalenes     Neck Exercises: Stretches   Other Neck Stretches scalene stretching 2 x 30 sec hold          Trigger Point Dry Needling - 03/29/16 1257    Consent Given? Yes   Education Handout Provided No  given previously   Muscles Treated Upper Body --  scalenes x 2 along on L with pistoning              PT Education - 03/29/16 1258    Education provided Yes   Education Details stretching for the scalenes/ SCM    Person(s) Educated  Patient   Methods Explanation;Handout   Comprehension Verbalized understanding          PT Short Term Goals - 03/08/16 1257      PT SHORT TERM GOAL #1   Title pt will be I with initial (03/21/2016)   Time 3   Period Weeks   Status Achieved           PT Long Term Goals - 03/29/16 1303      PT LONG TERM GOAL #1   Title pt will be I with all HEP as of last visit (04/11/2016)   Time 6   Period Weeks   Status On-going     PT LONG TERM GOAL #2   Title She will demonstrate decreased spasm in bil upper traps to reduce pain to </= 1/10 with cervical mobility and lifting/ carrying activities (04/11/2016)   Time 6   Period Weeks   Status Achieved     PT LONG TERM GOAL #3   Title she will improve R sidebending / and rotation by >/= 10 degrees and L rotation/ sidebending by >/= 5 degrees with </= 1/10 to assist with ADLs and safety during driving (2/44/0102)   Time 6   Period Weeks   Status Achieved     PT LONG TERM GOAL #4   Title She will be able to lift and carry >/= 20#  at waist/ shoulder height with </= 1/10 pain to assist with funcitonal lifting and carrying  activities as well as taking care of and holding her kids (04/11/2016)   Time 6   Period Weeks   Status Achieved     PT LONG TERM GOAL #5   Title FOTO score will improve foto score to </= 24% limited to demonstate improvement in function at discharge (04/11/2016)   Time 6   Period Weeks   Status Unable to assess               Plan - 03/29/16 1259    Clinical Impression Statement Golden is improving with cervical mobility with decreased pain but continues to have intermittent pain inthe LUE to the elbow. further testing revealed postive findings for thoracic outlet syndrome. DN was performed on L scalenes' pt was monitored throughout treatment. Following soft tissue work and first rib mobs she reported decreased tightness in the neck. Thoracic outlet was restest and pt exhibited no tingling or loss of pulse following today's treatment.    PT Next Visit Plan assess response to DN/ stretching, first rib  mobs, modalities PRN, FOTO, d/C?   PT Home Exercise Plan scalene stretch   Consulted and Agree with Plan of Care Patient      Patient will benefit from skilled therapeutic intervention in order to improve the following deficits and impairments:  Pain, Hypomobility, Increased fascial restricitons, Impaired flexibility, Improper body mechanics, Postural dysfunction, Decreased endurance, Decreased activity tolerance  Visit Diagnosis: Cervicalgia  Other muscle spasm  Abnormal posture     Problem List Patient Active Problem List   Diagnosis Date Noted  . Trapezius muscle spasm 02/28/2016  . Normal pregnancy in multigravida in third trimester 03/10/2014  . Spontaneous vaginal delivery 03/10/2014    Class: Status post  . Other known or suspected fetal abnormality, not elsewhere classified, affecting management of mother, antepartum condition or complication 72/53/6644   Starr [place] PT, DPT, LAT, ATC  03/29/16  1:05 PM      [place]  [facility] [address] [place], [place], [address]  Phone: [phone]   Fax:  [phone]  Name: Karen Morales MRN: [id number] Date of Birth: 26-Feb-1981  PHYSICAL THERAPY DISCHARGE SUMMARY  Visits from Start of Care: 5  Current functional level related to goals / functional outcomes: See goals   Remaining deficits: Intermittent L neck/ arm pain with prolonged sidelying and L arm above head activity, and reaching behind the back. Reduced with stretching and posture correction techniques.    Education / Equipment: HEP, posture, lifting and carrying activities.   Plan: Patient agrees to discharge.  Patient goals were partially met. Patient is being discharged due to not returning since the last visit.  ?????     Kristoffer Leamon PT, DPT, LAT, ATC  06/13/16  8:18 AM

## 2016-04-03 ENCOUNTER — Ambulatory Visit: Payer: 59 | Admitting: Family Medicine

## 2016-04-09 ENCOUNTER — Ambulatory Visit: Payer: 59 | Admitting: Physical Therapy

## 2016-06-07 DIAGNOSIS — Z6825 Body mass index (BMI) 25.0-25.9, adult: Secondary | ICD-10-CM | POA: Diagnosis not present

## 2016-06-07 DIAGNOSIS — Z01419 Encounter for gynecological examination (general) (routine) without abnormal findings: Secondary | ICD-10-CM | POA: Diagnosis not present

## 2016-06-11 DIAGNOSIS — L309 Dermatitis, unspecified: Secondary | ICD-10-CM | POA: Diagnosis not present

## 2016-06-11 DIAGNOSIS — L858 Other specified epidermal thickening: Secondary | ICD-10-CM | POA: Diagnosis not present

## 2016-06-11 DIAGNOSIS — B360 Pityriasis versicolor: Secondary | ICD-10-CM | POA: Diagnosis not present

## 2016-06-11 MED FILL — KETOCONAZOLE 2% CREAM: 2 | 20 days supply | Qty: 60 | Fill #0

## 2016-11-29 DIAGNOSIS — H5213 Myopia, bilateral: Secondary | ICD-10-CM | POA: Diagnosis not present

## 2017-06-06 ENCOUNTER — Ambulatory Visit
Admission: RE | Admit: 2017-06-06 | Discharge: 2017-06-06 | Disposition: A | Discharge status: Home / Self Care | Payer: 59 | Source: Ambulatory Visit | Attending: Sports Medicine | Admitting: Sports Medicine

## 2017-06-06 ENCOUNTER — Ambulatory Visit (INDEPENDENT_AMBULATORY_CARE_PROVIDER_SITE_OTHER): Payer: 59 | Admitting: Sports Medicine

## 2017-06-06 VITALS — BP 106/78 | Ht 63.0 in | Wt 134.0 lb

## 2017-06-06 DIAGNOSIS — M542 Cervicalgia: Secondary | ICD-10-CM | POA: Diagnosis not present

## 2017-06-06 DIAGNOSIS — M47812 Spondylosis without myelopathy or radiculopathy, cervical region: Secondary | ICD-10-CM | POA: Diagnosis not present

## 2017-06-06 NOTE — Progress Notes (Signed)
   HPI  CC: Neck pain Patient is here with complaints of acute onset neck pain over the past week.  Approximately 1 week she woke up with excruciating neck pain with some radiation down the right arm.  She denies any specific events that could have caused this to occur.  Pain was located throughout the entire neck.  Pain is described as constant, sharp, aching, and burning.  She states that after periods of time the pain and weakness in her right arm subsided.  She states that she currently has pain in her neck and stiffness but no radiation into either arm.  Traumatic: No  Location: bilateral neck and right arm  Quality: Constant sharp, aching, and previously burning.   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   Objective: BP 106/78   Ht 5\' 3"  (1.6 m)   Wt 134 lb (60.8 kg)   BMI 23.74 kg/m  Gen: NAD, well groomed, a/o x3, normal affect.  CV: Well-perfused. Warm.  Resp: Non-labored.  Neuro: Sensation intact throughout. No gross coordination deficits. DTRs +2 bilaterally. Gait: Nonpathologic posture, unremarkable stride without signs of limp or balance issues. Neck: No evidence of erythema, ecchymosis, bony deformity, or swelling. TTP along the paraspinal musculature bilaterally, greatest along right side and the scalene muscles.  No spinous process tenderness.  Range of motion slightly limited (~5) in extension, otherwise fully intact in all directions.  Spurling's negative.    Right Arm: Strength 5/5 bilaterally, sensation intact and symmetric bilaterally.  Assessment and Plan:  Neck pain Patient is presenting with acute onset neck pain.  Etiology likely paracervical/scalene muscle spasms.  However, some concerning symptoms of possible right-sided radiculopathy radiating down into the right hand.  Patient's history of gymnastics with vague prior neck sprain/injury is concerning for possible chronic anterior and/or posterior longitudinal ligamental injury. - Cervical XR ordered - OTC Naproxen 440mg  BID x5 days then BID PRN - Isometric cervical muscle strengthening exercises  - Patient to f/u PRN >> especially if the radicular Sxs resurface.  Next: if radicular symptoms recur/persist would strongly consider additional imaging with MRI.   Orders Placed This Encounter  Procedures  . DG Cervical Spine Complete    Standing Status:   Future    Standing Expiration Date:   08/06/2018    Order Specific Question:   Reason for Exam (SYMPTOM  OR DIAGNOSIS REQUIRED)    Answer:   neck pain    Order Specific Question:   Is patient pregnant?    Answer:   No    Order Specific Question:   Preferred imaging location?    Answer:   [facility]    Order Specific Question:   Radiology Contrast Protocol - do NOT remove file path    Answer:   [email or web address]    Kathee DeltonIan D McKeag, MD,MS [facility] Fellow 06/06/2017 12:23 PM

## 2017-06-06 NOTE — Assessment & Plan Note (Signed)
Patient is presenting with acute onset neck pain.  Etiology likely paracervical/scalene muscle spasms.  However, some concerning symptoms of possible right-sided radiculopathy radiating down into the right hand.  Patient's history of gymnastics with vague prior neck sprain/injury is concerning for possible chronic anterior and/or posterior longitudinal ligamental injury. - Cervical XR ordered - OTC Naproxen 440mg  BID x5 days then BID PRN - Isometric cervical muscle strengthening exercises  - Patient to f/u PRN >> especially if the radicular Sxs resurface.  Next: if radicular symptoms recur/persist would strongly consider additional imaging with MRI.

## 2017-06-07 ENCOUNTER — Ambulatory Visit: Payer: 59 | Admitting: Family Medicine

## 2017-09-17 DIAGNOSIS — Z6824 Body mass index (BMI) 24.0-24.9, adult: Secondary | ICD-10-CM | POA: Diagnosis not present

## 2017-09-17 DIAGNOSIS — Z01419 Encounter for gynecological examination (general) (routine) without abnormal findings: Secondary | ICD-10-CM | POA: Diagnosis not present

## 2018-02-05 DIAGNOSIS — H5213 Myopia, bilateral: Secondary | ICD-10-CM | POA: Diagnosis not present

## 2018-04-18 IMAGING — DX DG CERVICAL SPINE COMPLETE 4+V
5 series · 5 of 5 positions shown · non-contrast
Comparison: No recent prior .

CLINICAL DATA: Neck pain.

EXAM:
CERVICAL SPINE - COMPLETE 4+ VIEW

[dg cervical spine complete (1 of 5)]
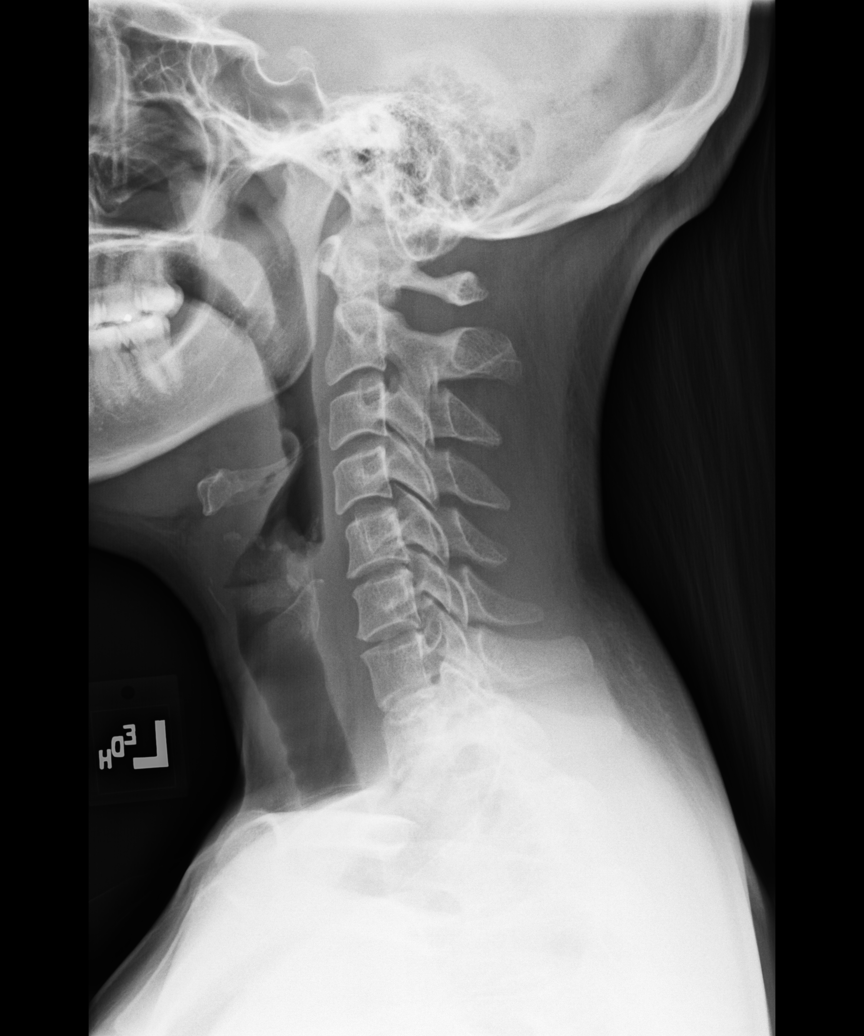

[dg cervical spine complete (2 of 5)]
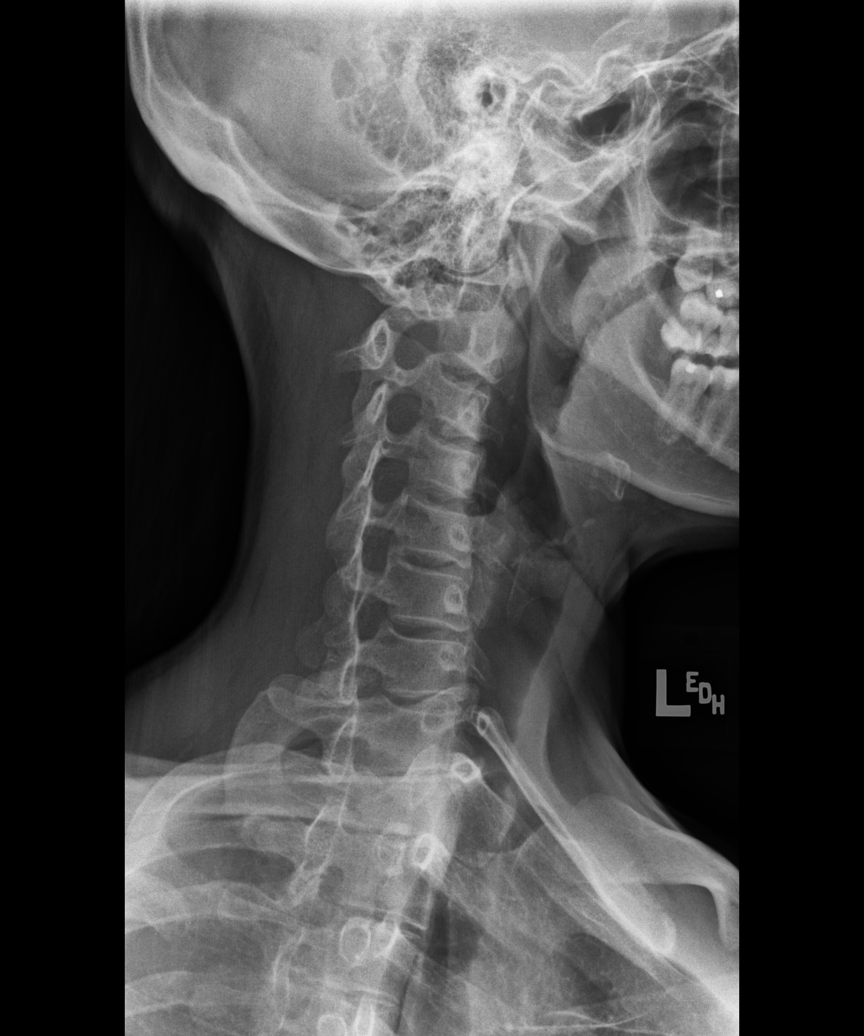

[dg cervical spine complete (3 of 5)]
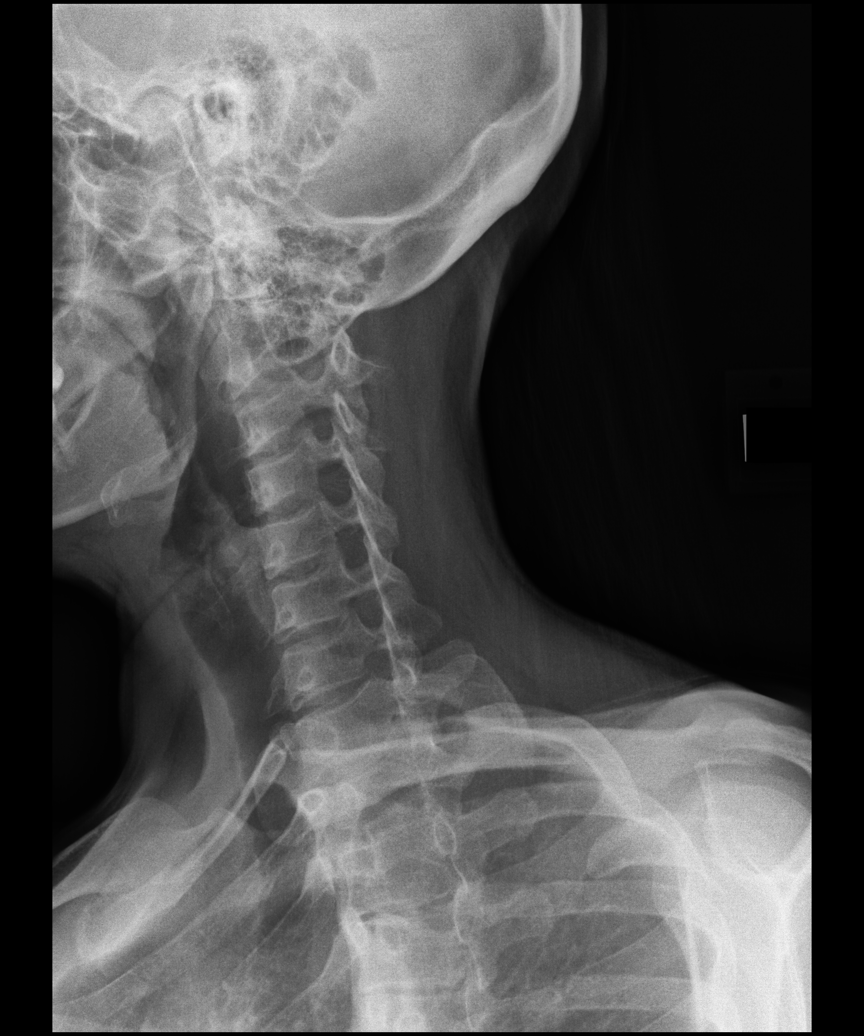

[dg cervical spine complete (4 of 5)]
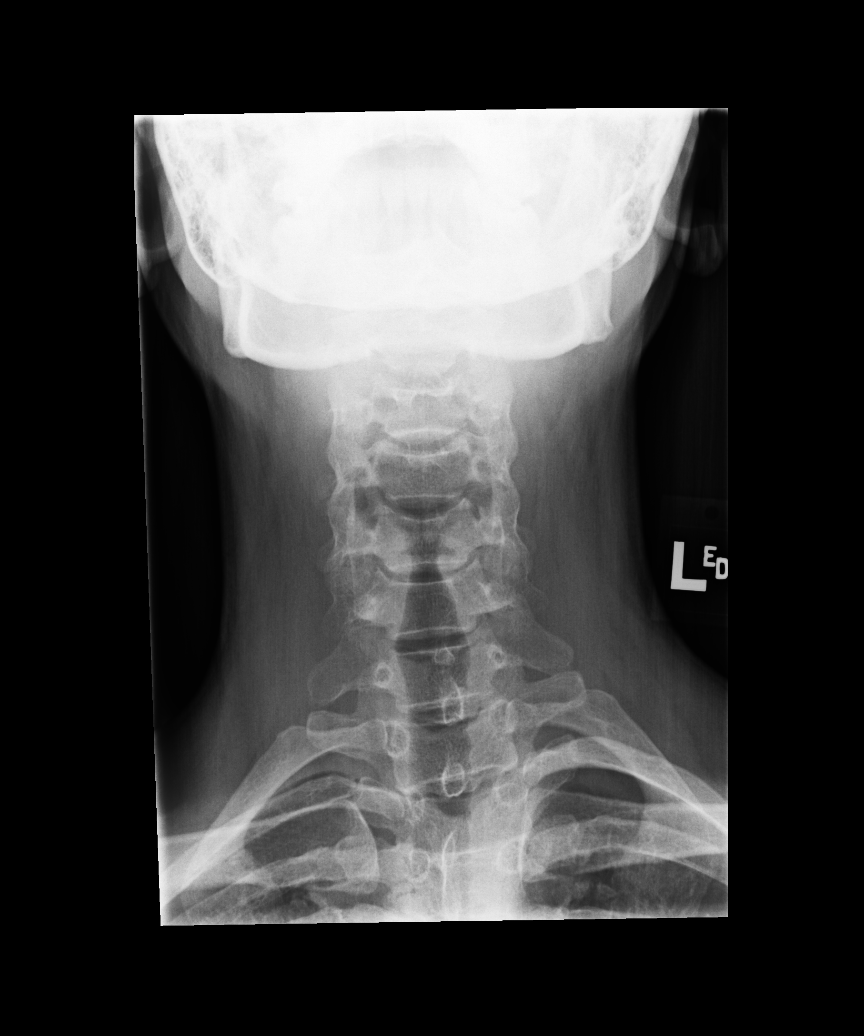

[dg cervical spine complete (5 of 5)]
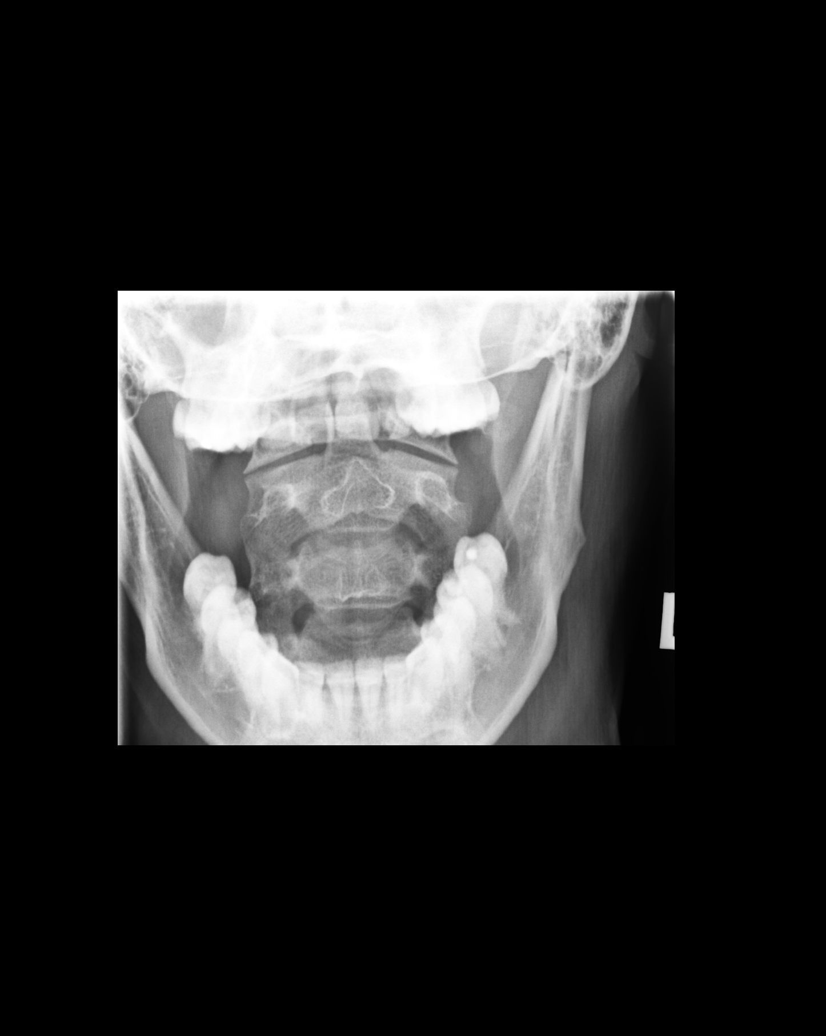

[5 of 5 positions shown; findings below may reference images not displayed]

FINDINGS: Mild degenerative change cervical spine. Degenerative changes most
prominent at C5-C6, C6-C7, C7-T1. Mild straightening cervical spine.
No acute bony or joint abnormality identified. No evidence of
fracture. Pulmonary apices are clear.
IMPRESSION: Mild degenerative changes cervical spine. Degenerative changes most
prominent C5-C6, C6-C7, C7-T1. Mild straightening cervical spine. No
acute abnormality .

## 2018-09-18 DIAGNOSIS — Z6824 Body mass index (BMI) 24.0-24.9, adult: Secondary | ICD-10-CM | POA: Diagnosis not present

## 2018-09-18 DIAGNOSIS — Z01419 Encounter for gynecological examination (general) (routine) without abnormal findings: Secondary | ICD-10-CM | POA: Diagnosis not present

## 2019-04-30 DIAGNOSIS — H5213 Myopia, bilateral: Secondary | ICD-10-CM | POA: Diagnosis not present

## 2019-05-05 DIAGNOSIS — Z30433 Encounter for removal and reinsertion of intrauterine contraceptive device: Secondary | ICD-10-CM | POA: Diagnosis not present

## 2019-06-17 DIAGNOSIS — Z30431 Encounter for routine checking of intrauterine contraceptive device: Secondary | ICD-10-CM | POA: Diagnosis not present

## 2019-10-07 DIAGNOSIS — Z01419 Encounter for gynecological examination (general) (routine) without abnormal findings: Secondary | ICD-10-CM | POA: Diagnosis not present

## 2019-10-07 DIAGNOSIS — Z6824 Body mass index (BMI) 24.0-24.9, adult: Secondary | ICD-10-CM | POA: Diagnosis not present

## 2020-09-20 DIAGNOSIS — H5213 Myopia, bilateral: Secondary | ICD-10-CM | POA: Diagnosis not present

## 2020-11-02 DIAGNOSIS — Z01419 Encounter for gynecological examination (general) (routine) without abnormal findings: Secondary | ICD-10-CM | POA: Diagnosis not present

## 2020-11-02 DIAGNOSIS — Z6823 Body mass index (BMI) 23.0-23.9, adult: Secondary | ICD-10-CM | POA: Diagnosis not present

## 2021-01-17 ENCOUNTER — Other Ambulatory Visit (HOSPITAL_COMMUNITY): Payer: Self-pay

## 2021-01-17 MED ORDER — CARESTART COVID-19 HOME TEST VI KIT
PACK | 0 refills | Status: DC
  Filled 2021-01-17: qty 4, 4d supply, fill #0

## 2021-06-19 DIAGNOSIS — Z13220 Encounter for screening for lipoid disorders: Secondary | ICD-10-CM | POA: Diagnosis not present

## 2021-06-19 DIAGNOSIS — Z0000 Encounter for general adult medical examination without abnormal findings: Secondary | ICD-10-CM | POA: Diagnosis not present

## 2021-09-01 ENCOUNTER — Other Ambulatory Visit (HOSPITAL_BASED_OUTPATIENT_CLINIC_OR_DEPARTMENT_OTHER): Payer: Self-pay

## 2021-09-01 MED ORDER — AZELASTINE HCL 0.1 % NA SOLN
NASAL | 1 refills | Status: DC
  Filled 2021-09-01: qty 30, 30d supply, fill #0

## 2021-09-13 ENCOUNTER — Other Ambulatory Visit (HOSPITAL_BASED_OUTPATIENT_CLINIC_OR_DEPARTMENT_OTHER): Payer: Self-pay

## 2021-11-30 ENCOUNTER — Other Ambulatory Visit: Payer: Self-pay | Admitting: Obstetrics and Gynecology

## 2021-11-30 DIAGNOSIS — R928 Other abnormal and inconclusive findings on diagnostic imaging of breast: Secondary | ICD-10-CM

## 2021-12-09 ENCOUNTER — Ambulatory Visit: Payer: No Typology Code available for payment source

## 2021-12-09 ENCOUNTER — Ambulatory Visit
Admission: RE | Admit: 2021-12-09 | Discharge: 2021-12-09 | Disposition: A | Discharge status: Home / Self Care | Payer: No Typology Code available for payment source | Source: Ambulatory Visit | Attending: Obstetrics and Gynecology | Admitting: Obstetrics and Gynecology

## 2021-12-09 DIAGNOSIS — R928 Other abnormal and inconclusive findings on diagnostic imaging of breast: Secondary | ICD-10-CM

## 2022-05-08 ENCOUNTER — Telehealth: Payer: Self-pay

## 2022-05-08 NOTE — Telephone Encounter (Signed)
Called patient to schedule new patient appt; stated she will call back on her lunch break.

## 2022-05-09 ENCOUNTER — Encounter: Payer: Self-pay | Admitting: Family Medicine

## 2022-05-09 ENCOUNTER — Ambulatory Visit (INDEPENDENT_AMBULATORY_CARE_PROVIDER_SITE_OTHER): Payer: No Typology Code available for payment source | Admitting: Family Medicine

## 2022-05-09 VITALS — BP 122/72 | HR 77 | Temp 98.3°F | Ht 63.0 in | Wt 134.0 lb

## 2022-05-09 DIAGNOSIS — Z7689 Persons encountering health services in other specified circumstances: Secondary | ICD-10-CM | POA: Diagnosis not present

## 2022-05-09 DIAGNOSIS — Z13220 Encounter for screening for lipoid disorders: Secondary | ICD-10-CM | POA: Diagnosis not present

## 2022-05-09 DIAGNOSIS — B360 Pityriasis versicolor: Secondary | ICD-10-CM

## 2022-05-09 DIAGNOSIS — Z23 Encounter for immunization: Secondary | ICD-10-CM

## 2022-05-09 DIAGNOSIS — Z0000 Encounter for general adult medical examination without abnormal findings: Secondary | ICD-10-CM | POA: Diagnosis not present

## 2022-05-09 MED ORDER — KETOCONAZOLE 2 % EX CREA: TOPICAL_CREAM | Freq: Every day | CUTANEOUS | Status: AC

## 2022-05-09 NOTE — Progress Notes (Signed)
New Patient Office Visit  Subjective    Patient ID: Karen Morales, female    DOB: Jul 31, 1981  Age: 41 y.o. MRN: [id number]  CC:  Chief Complaint  Patient presents with   Establish Care    HPI Karen Morales presents to establish care with new PCP. Oriented to practice routines and expectations. Her concerns today include a rash to her chest and back that comes and goes over the past few years, it does itch, she has tried some over the counter creams with no relief. Eats regular diet, carb controlled, drinks a lot of water. She does not have an exercise routine. I stressed the importance of a healthy diet and exercise to prevent cardiovascular disease.  Her last physical exam was unknown- consents to exam today with fasting labs tomorrow PAP 11/22/2021, [facility], LMP unknown, Mirena IUD Mammogram 11/29/2021    Outpatient Encounter Medications as of 05/09/2022  Medication Sig   azelastine (ASTELIN) 0.1 % nasal spray Use 1-2 sprays in each nostril 1-2 times a day for nasal congestion (Patient not taking: Reported on 05/09/2022)   COVID-19 At Home Antigen Test (CARESTART COVID-19 HOME TEST) KIT Use as directed. (Patient not taking: Reported on 05/09/2022)   Facility-Administered Encounter Medications as of 05/09/2022  Medication   ketoconazole (NIZORAL) 2 % cream    Allergies  Allergen Reactions   Clindamycin/Lincomycin Itching   Penicillins Hives    Past Medical History:  Diagnosis Date   HSV infection    Medical history non-contributory     Past Surgical History:  Procedure Laterality Date   LEEP     NO PAST SURGERIES      History reviewed. No pertinent family history.  Social History   Socioeconomic History   Marital status: Married    Spouse name: Not on file   Number of children: Not on file   Years of education: Not on file   Highest education level: Not on file  Occupational History   Not on file  Tobacco Use   Smoking  status: Never   Smokeless tobacco: Never  Substance and Sexual Activity   Alcohol use: No   Drug use: No   Sexual activity: Never  Other Topics Concern   Not on file  Social History Narrative   Not on file   Social Determinants of Health   Financial Resource Strain: Not on file  Food Insecurity: Not on file  Transportation Needs: Not on file  Physical Activity: Not on file  Stress: Not on file  Social Connections: Not on file  Intimate Partner Violence: Not on file    Review of Systems  Skin:  Positive for itching and rash.  All other systems reviewed and are negative.       Objective    BP 122/72   Pulse 77   Temp 98.3 F (36.8 C) (Oral)   Ht 5' 3"  (1.6 m)   Wt 134 lb (60.8 kg)   SpO2 97%   BMI 23.74 kg/m   Physical Exam Constitutional:      Appearance: Normal appearance. She is normal weight.  HENT:     Head: Normocephalic and atraumatic.     Right Ear: Tympanic membrane, ear canal and external ear normal.     Left Ear: Tympanic membrane, ear canal and external ear normal.     Nose: Nose normal.     Mouth/Throat:     Mouth: Mucous membranes are moist.  Pharynx: Oropharynx is clear.  Eyes:     Extraocular Movements: Extraocular movements intact.     Conjunctiva/sclera: Conjunctivae normal.     Pupils: Pupils are equal, round, and reactive to light.  Cardiovascular:     Rate and Rhythm: Normal rate and regular rhythm.     Pulses: Normal pulses.     Heart sounds: Normal heart sounds.  Pulmonary:     Effort: Pulmonary effort is normal.     Breath sounds: Normal breath sounds.  Abdominal:     General: Abdomen is flat. Bowel sounds are normal.     Palpations: Abdomen is soft.  Musculoskeletal:        General: Normal range of motion.     Cervical back: Normal range of motion and neck supple.  Skin:    General: Skin is warm and dry.     Capillary Refill: Capillary refill takes less than 2 seconds.     Findings: Rash present. Rash is macular and  papular.       Neurological:     General: No focal deficit present.     Mental Status: She is alert and oriented to person, place, and time.  Psychiatric:        Mood and Affect: Mood normal.        Behavior: Behavior normal.        Thought Content: Thought content normal.        Judgment: Judgment normal.      Assessment & Plan:   Physical exam, annual - Plan: CBC with Differential/Platelet, COMPLETE METABOLIC PANEL WITH GFR, Lipid panel  Encounter to establish care with new doctor  Routine adult health maintenance  Tinea versicolor - Plan: ketoconazole (NIZORAL) 2 % cream She has a maculopapular rash to both of her breasts that has been resistant to OTC treatments so start Ketoconazole 2% cream daily for 2 weeks.  Screening for lipid disorders   Return in about 2 months (around 07/09/2022) for physical exam.   Rubie Maid, FNP

## 2022-05-10 ENCOUNTER — Other Ambulatory Visit (INDEPENDENT_AMBULATORY_CARE_PROVIDER_SITE_OTHER): Payer: No Typology Code available for payment source

## 2022-05-10 DIAGNOSIS — Z23 Encounter for immunization: Secondary | ICD-10-CM

## 2022-05-11 ENCOUNTER — Other Ambulatory Visit: Payer: No Typology Code available for payment source

## 2022-05-11 ENCOUNTER — Other Ambulatory Visit: Payer: Self-pay

## 2022-05-11 DIAGNOSIS — Z0000 Encounter for general adult medical examination without abnormal findings: Secondary | ICD-10-CM

## 2022-05-11 LAB — CBC WITH DIFFERENTIAL/PLATELET
Absolute Monocytes: 614 cells/uL (ref 200–950)
Basophils Relative: 0.8 %
HCT: 40.6 % (ref 35.0–45.0)
MCH: 33.2 pg — ABNORMAL HIGH (ref 27.0–33.0)
Neutrophils Relative %: 68.9 %
RBC: 4.13 10*6/uL (ref 3.80–5.10)
Total Lymphocyte: 20.4 %
WBC: 7.4 10*3/uL (ref 3.8–10.8)

## 2022-05-12 LAB — COMPLETE METABOLIC PANEL WITH GFR
AG Ratio: 1.8 (calc) (ref 1.0–2.5)
ALT: 14 U/L (ref 6–29)
AST: 11 U/L (ref 10–30)
Albumin: 4.2 g/dL (ref 3.6–5.1)
Alkaline phosphatase (APISO): 39 U/L (ref 31–125)
BUN: 12 mg/dL (ref 7–25)
CO2: 27 mmol/L (ref 20–32)
Calcium: 9.3 mg/dL (ref 8.6–10.2)
Chloride: 106 mmol/L (ref 98–110)
Creat: 0.62 mg/dL (ref 0.50–0.99)
Globulin: 2.3 g/dL (calc) (ref 1.9–3.7)
Glucose, Bld: 91 mg/dL (ref 65–99)
Potassium: 4.1 mmol/L (ref 3.5–5.3)
Sodium: 140 mmol/L (ref 135–146)
Total Bilirubin: 0.6 mg/dL (ref 0.2–1.2)
Total Protein: 6.5 g/dL (ref 6.1–8.1)
eGFR: 115 mL/min/{1.73_m2} (ref >=60)

## 2022-05-12 LAB — CBC WITH DIFFERENTIAL/PLATELET
Basophils Absolute: 59 cells/uL (ref 0–200)
Eosinophils Absolute: 118 cells/uL (ref 15–500)
Eosinophils Relative: 1.6 %
Hemoglobin: 13.7 g/dL (ref 11.7–15.5)
Lymphs Abs: 1510 cells/uL (ref 850–3900)
MCHC: 33.7 g/dL (ref 32.0–36.0)
MCV: 98.3 fL (ref 80.0–100.0)
MPV: 10.6 fL (ref 7.5–12.5)
Monocytes Relative: 8.3 %
Neutro Abs: 5099 cells/uL (ref 1500–7800)
Platelets: 222 10*3/uL (ref 140–400)
RDW: 11.6 % (ref 11.0–15.0)

## 2022-05-12 LAB — LIPID PANEL
Cholesterol: 134 mg/dL (ref <200)
HDL: 50 mg/dL (ref >=50)
LDL Cholesterol (Calc): 70 mg/dL (calc)
Non-HDL Cholesterol (Calc): 84 mg/dL (calc) (ref <130)
Total CHOL/HDL Ratio: 2.7 (calc) (ref <5.0)
Triglycerides: 48 mg/dL (ref <150)

## 2022-06-12 ENCOUNTER — Telehealth: Payer: Self-pay

## 2022-06-12 NOTE — Telephone Encounter (Signed)
Pt called in stating that she received her flu shot at her last ov. Pt just needs to have proof of that immunization sent to her MyChart or possibly faxed to her please.   Cb#: [phone]

## 2022-06-14 NOTE — Telephone Encounter (Signed)
Called and spoke w/pt 06/12/22, told pt  that she can go into her Mychart and print our there immunization sheet which will show that she as gotten the flu vaccine and send/email that to her employer.   Pt voiced understanding.

## 2022-09-22 ENCOUNTER — Other Ambulatory Visit (HOSPITAL_BASED_OUTPATIENT_CLINIC_OR_DEPARTMENT_OTHER): Payer: Self-pay

## 2022-09-24 ENCOUNTER — Other Ambulatory Visit (HOSPITAL_BASED_OUTPATIENT_CLINIC_OR_DEPARTMENT_OTHER): Payer: Self-pay

## 2022-09-28 ENCOUNTER — Other Ambulatory Visit: Payer: Self-pay | Admitting: Family Medicine

## 2022-09-28 ENCOUNTER — Other Ambulatory Visit (HOSPITAL_BASED_OUTPATIENT_CLINIC_OR_DEPARTMENT_OTHER): Payer: Self-pay

## 2022-10-01 ENCOUNTER — Other Ambulatory Visit (HOSPITAL_BASED_OUTPATIENT_CLINIC_OR_DEPARTMENT_OTHER): Payer: Self-pay

## 2022-10-01 MED ORDER — AZELASTINE HCL 0.1 % NA SOLN
NASAL | 1 refills | Status: AC
  Filled 2022-10-01: qty 30, 30d supply, fill #0

## 2022-11-06 DIAGNOSIS — H5213 Myopia, bilateral: Secondary | ICD-10-CM | POA: Diagnosis not present

## 2022-12-14 DIAGNOSIS — Z01419 Encounter for gynecological examination (general) (routine) without abnormal findings: Secondary | ICD-10-CM | POA: Diagnosis not present

## 2022-12-14 DIAGNOSIS — Z6823 Body mass index (BMI) 23.0-23.9, adult: Secondary | ICD-10-CM | POA: Diagnosis not present

## 2022-12-14 DIAGNOSIS — Z1231 Encounter for screening mammogram for malignant neoplasm of breast: Secondary | ICD-10-CM | POA: Diagnosis not present

## 2023-01-30 ENCOUNTER — Other Ambulatory Visit (HOSPITAL_BASED_OUTPATIENT_CLINIC_OR_DEPARTMENT_OTHER): Payer: Self-pay

## 2023-01-30 MED ORDER — VALACYCLOVIR HCL 500 MG PO TABS
1000.0000 mg | ORAL_TABLET | Freq: Two times a day (BID) | ORAL | 1 refills | Status: AC
  Filled 2023-01-30: qty 12, 3d supply, fill #0

## 2023-02-21 DIAGNOSIS — R8290 Unspecified abnormal findings in urine: Secondary | ICD-10-CM | POA: Diagnosis not present

## 2023-02-21 DIAGNOSIS — N898 Other specified noninflammatory disorders of vagina: Secondary | ICD-10-CM | POA: Diagnosis not present

## 2023-02-22 ENCOUNTER — Other Ambulatory Visit (HOSPITAL_BASED_OUTPATIENT_CLINIC_OR_DEPARTMENT_OTHER): Payer: Self-pay

## 2023-02-22 MED ORDER — FLUCONAZOLE 150 MG PO TABS
150.0000 mg | ORAL_TABLET | ORAL | 0 refills | Status: DC
  Filled 2023-02-22: qty 2, 3d supply, fill #0

## 2023-05-16 ENCOUNTER — Other Ambulatory Visit (HOSPITAL_BASED_OUTPATIENT_CLINIC_OR_DEPARTMENT_OTHER): Payer: Self-pay

## 2023-05-16 MED ORDER — FLULAVAL 0.5 ML IM SUSY
PREFILLED_SYRINGE | Freq: Once | INTRAMUSCULAR | 0 refills | Status: AC
  Filled 2023-05-16: qty 0.5, 1d supply, fill #0

## 2023-06-12 ENCOUNTER — Other Ambulatory Visit (HOSPITAL_BASED_OUTPATIENT_CLINIC_OR_DEPARTMENT_OTHER): Payer: Self-pay

## 2023-06-17 ENCOUNTER — Other Ambulatory Visit (HOSPITAL_BASED_OUTPATIENT_CLINIC_OR_DEPARTMENT_OTHER): Payer: Self-pay

## 2023-07-19 ENCOUNTER — Other Ambulatory Visit (HOSPITAL_BASED_OUTPATIENT_CLINIC_OR_DEPARTMENT_OTHER): Payer: Self-pay

## 2023-07-19 DIAGNOSIS — B3731 Acute candidiasis of vulva and vagina: Secondary | ICD-10-CM | POA: Diagnosis not present

## 2023-07-19 DIAGNOSIS — L293 Anogenital pruritus, unspecified: Secondary | ICD-10-CM | POA: Diagnosis not present

## 2023-07-19 MED ORDER — CLOBETASOL PROPIONATE 0.05 % EX OINT
1.0000 | TOPICAL_OINTMENT | Freq: Every day | CUTANEOUS | 0 refills | Status: AC
  Filled 2023-07-19 (×2): qty 30, 30d supply, fill #0

## 2023-07-19 MED ORDER — FLUCONAZOLE 150 MG PO TABS
150.0000 mg | ORAL_TABLET | ORAL | 0 refills | Status: DC
  Filled 2023-07-19: qty 2, 3d supply, fill #0

## 2023-08-16 DIAGNOSIS — N9089 Other specified noninflammatory disorders of vulva and perineum: Secondary | ICD-10-CM | POA: Diagnosis not present

## 2024-01-30 DIAGNOSIS — Z01419 Encounter for gynecological examination (general) (routine) without abnormal findings: Secondary | ICD-10-CM | POA: Diagnosis not present

## 2024-01-30 DIAGNOSIS — Z1231 Encounter for screening mammogram for malignant neoplasm of breast: Secondary | ICD-10-CM | POA: Diagnosis not present

## 2024-01-30 DIAGNOSIS — Z6823 Body mass index (BMI) 23.0-23.9, adult: Secondary | ICD-10-CM | POA: Diagnosis not present

## 2024-01-30 DIAGNOSIS — L280 Lichen simplex chronicus: Secondary | ICD-10-CM | POA: Diagnosis not present

## 2024-02-05 ENCOUNTER — Other Ambulatory Visit: Payer: Self-pay | Admitting: Obstetrics and Gynecology

## 2024-02-05 DIAGNOSIS — R928 Other abnormal and inconclusive findings on diagnostic imaging of breast: Secondary | ICD-10-CM

## 2024-02-06 ENCOUNTER — Ambulatory Visit
Admission: RE | Admit: 2024-02-06 | Discharge: 2024-02-06 | Disposition: A | Discharge status: Home / Self Care | Source: Ambulatory Visit | Attending: Obstetrics and Gynecology | Admitting: Obstetrics and Gynecology

## 2024-02-06 ENCOUNTER — Ambulatory Visit

## 2024-02-06 DIAGNOSIS — R928 Other abnormal and inconclusive findings on diagnostic imaging of breast: Secondary | ICD-10-CM | POA: Diagnosis not present

## 2024-02-18 ENCOUNTER — Other Ambulatory Visit

## 2024-02-18 ENCOUNTER — Encounter

## 2024-05-12 ENCOUNTER — Other Ambulatory Visit (HOSPITAL_BASED_OUTPATIENT_CLINIC_OR_DEPARTMENT_OTHER): Payer: Self-pay

## 2024-05-12 MED ORDER — FLUZONE 0.5 ML IM SUSY
0.5000 mL | PREFILLED_SYRINGE | Freq: Once | INTRAMUSCULAR | 0 refills | Status: AC
  Filled 2024-05-12: qty 0.5, 1d supply, fill #0

## 2024-05-14 DIAGNOSIS — H5213 Myopia, bilateral: Secondary | ICD-10-CM | POA: Diagnosis not present

## 2024-05-14 DIAGNOSIS — H52223 Regular astigmatism, bilateral: Secondary | ICD-10-CM | POA: Diagnosis not present

## 2024-05-28 ENCOUNTER — Ambulatory Visit: Admitting: Family Medicine

## 2024-05-28 ENCOUNTER — Encounter: Payer: Self-pay | Admitting: Family Medicine

## 2024-05-28 VITALS — BP 114/72 | HR 80 | Temp 98.8°F | Ht 63.0 in | Wt 136.2 lb

## 2024-05-28 DIAGNOSIS — R5383 Other fatigue: Secondary | ICD-10-CM

## 2024-05-28 DIAGNOSIS — L918 Other hypertrophic disorders of the skin: Secondary | ICD-10-CM

## 2024-05-28 NOTE — Assessment & Plan Note (Signed)
 CBC, CMP, vitamin D, TSH today. Continue vitamins with 200 units D3 and 2000mg  b12. Ensure adequate sleep and proper nutrition. Follow up if symptoms return

## 2024-05-28 NOTE — Progress Notes (Signed)
 Subjective:  HPI: Karen Morales is a 43 y.o. female presenting on 05/28/2024 for Acute Visit (Tiredness, redness around a mole  that she has had for years. )   HPI Patient is in today for fatigue for months and feeling extra tired in the afternoons. She is also having more frequent menstrual cycles. Has started taking a multivitamin with B12 and feels improvement. Denies lightheadedness, dizziness, insomnia or frequent night wakings, recent illness, night sweats, weight loss She does only sleep 5-6 hours nightly due to her husbands routine. Eats a healthy diet and is not eating high carb at lunch, exercises regularly.  Other concerns include an inflamed skin tag on her left arm. The area is tender and red for past 1-2 weeks however symptoms are overall improving. It is painful when she rubs up against it. Would like this removed.   Skin excision  Date/Time: 05/28/2024 4:21 PM  Performed by: Kayla Jeoffrey RAMAN, FNP Authorized by: Kayla Jeoffrey RAMAN, FNP   Number of Lesions: 1 Lesion 1:    Body area: upper extremity   Upper extremity location: L upper arm   Malignancy: benign lesion     Destruction method: cryotherapy        Review of Systems  All other systems reviewed and are negative.   Relevant past medical history reviewed and updated as indicated.   Past Medical History:  Diagnosis Date   HSV infection    Medical history non-contributory      Past Surgical History:  Procedure Laterality Date   LEEP     NO PAST SURGERIES      Allergies and medications reviewed and updated.   Current Outpatient Medications:    azelastine  (ASTELIN ) 0.1 % nasal spray, Use 1-2 sprays in each nostril 1-2 times a day for nasal congestion, Disp: 30 mL, Rfl: 1   clobetasol  ointment (TEMOVATE ) 0.05 %, Apply a thin layer to affected area(s) topically at bedtime for 4 weeks, Disp: 30 g, Rfl: 0   valACYclovir  (VALTREX ) 500 MG tablet, Take 2 tablets (1,000 mg total) by mouth 2 (two)  times daily., Disp: 12 tablet, Rfl: 1  Allergies  Allergen Reactions   Clindamycin/Lincomycin Itching   Penicillins Hives    Objective:   BP 114/72 (BP Location: Left Arm, Patient Position: Sitting, Cuff Size: Normal)   Pulse 80   Temp 98.8 F (37.1 C)   Ht 5' 3 (1.6 m)   Wt 136 lb 3.2 oz (61.8 kg)   SpO2 98%   BMI 24.13 kg/m      05/28/2024    3:59 PM 05/09/2022    2:59 PM 06/06/2017   11:14 AM  Vitals with BMI  Height 5' 3 5' 3 5' 3  Weight 136 lbs 3 oz 134 lbs 134 lbs  BMI 24.13 23.74 23.74  Systolic 114 122 893  Diastolic 72 72 78  Pulse 80 77      Physical Exam Vitals and nursing note reviewed.  Constitutional:      Appearance: Normal appearance. She is normal weight.  HENT:     Head: Normocephalic and atraumatic.  Cardiovascular:     Rate and Rhythm: Normal rate and regular rhythm.     Pulses: Normal pulses.     Heart sounds: Normal heart sounds.  Pulmonary:     Effort: Pulmonary effort is normal.     Breath sounds: Normal breath sounds.  Skin:    General: Skin is warm and dry.  Neurological:     General:  No focal deficit present.     Mental Status: She is alert and oriented to person, place, and time. Mental status is at baseline.  Psychiatric:        Mood and Affect: Mood normal.        Behavior: Behavior normal.        Thought Content: Thought content normal.        Judgment: Judgment normal.     Assessment & Plan:  Other fatigue Assessment & Plan: CBC, CMP, vitamin D, TSH today. Continue vitamins with 200 units D3 and 2000mg  b12. Ensure adequate sleep and proper nutrition. Follow up if symptoms return   Orders: -     CBC with Differential/Platelet -     Comprehensive metabolic panel with GFR -     Lipid panel -     TSH -     VITAMIN D 25 Hydroxy (Vit-D Deficiency, Fractures)  Inflamed acrochordon Assessment & Plan: Symptoms improving. Area is pink and tender. Discussed risks vs benefits of cryotherapy and she would like to  proceed. No immediate complications. Advised to keep area clean and dry.   Other orders -     Skin excision     Follow up plan: Return if symptoms worsen or fail to improve.  Jeoffrey GORMAN Barrio, FNP

## 2024-05-28 NOTE — Assessment & Plan Note (Signed)
 Symptoms improving. Area is pink and tender. Discussed risks vs benefits of cryotherapy and she would like to proceed. No immediate complications. Advised to keep area clean and dry.

## 2024-05-29 LAB — CBC WITH DIFFERENTIAL/PLATELET
Absolute Lymphocytes: 2320 {cells}/uL (ref 850–3900)
Absolute Monocytes: 736 {cells}/uL (ref 200–950)
Basophils Absolute: 72 {cells}/uL (ref 0–200)
Basophils Relative: 0.9 %
Eosinophils Absolute: 216 {cells}/uL (ref 15–500)
Eosinophils Relative: 2.7 %
HCT: 38.8 % (ref 35.0–45.0)
Hemoglobin: 13 g/dL (ref 11.7–15.5)
MCH: 32.2 pg (ref 27.0–33.0)
MCHC: 33.5 g/dL (ref 32.0–36.0)
MCV: 96 fL (ref 80.0–100.0)
MPV: 10.4 fL (ref 7.5–12.5)
Monocytes Relative: 9.2 %
Neutro Abs: 4656 {cells}/uL (ref 1500–7800)
Neutrophils Relative %: 58.2 %
Platelets: 299 Thousand/uL (ref 140–400)
RBC: 4.04 Million/uL (ref 3.80–5.10)
RDW: 11.9 % (ref 11.0–15.0)
Total Lymphocyte: 29 %
WBC: 8 Thousand/uL (ref 3.8–10.8)

## 2024-05-29 LAB — LIPID PANEL
Cholesterol: 150 mg/dL (ref <200)
HDL: 50 mg/dL (ref >=50)
LDL Cholesterol (Calc): 74 mg/dL
Non-HDL Cholesterol (Calc): 100 mg/dL (ref <130)
Total CHOL/HDL Ratio: 3 (calc) (ref <5.0)
Triglycerides: 161 mg/dL — ABNORMAL HIGH (ref <150)

## 2024-05-29 LAB — COMPREHENSIVE METABOLIC PANEL WITH GFR
AG Ratio: 2.1 (calc) (ref 1.0–2.5)
ALT: 11 U/L (ref 6–29)
AST: 13 U/L (ref 10–30)
Albumin: 4.4 g/dL (ref 3.6–5.1)
Alkaline phosphatase (APISO): 36 U/L (ref 31–125)
BUN: 15 mg/dL (ref 7–25)
CO2: 27 mmol/L (ref 20–32)
Calcium: 9.3 mg/dL (ref 8.6–10.2)
Chloride: 107 mmol/L (ref 98–110)
Creat: 0.63 mg/dL (ref 0.50–0.99)
Globulin: 2.1 g/dL (ref 1.9–3.7)
Glucose, Bld: 81 mg/dL (ref 65–99)
Potassium: 3.8 mmol/L (ref 3.5–5.3)
Sodium: 141 mmol/L (ref 135–146)
Total Bilirubin: 0.3 mg/dL (ref 0.2–1.2)
Total Protein: 6.5 g/dL (ref 6.1–8.1)
eGFR: 114 mL/min/1.73m2 (ref >=60)

## 2024-05-29 LAB — TSH: TSH: 0.93 m[IU]/L

## 2024-05-29 LAB — VITAMIN D 25 HYDROXY (VIT D DEFICIENCY, FRACTURES): Vit D, 25-Hydroxy: 51 ng/mL (ref 30–100)

## 2024-06-01 ENCOUNTER — Ambulatory Visit: Payer: Self-pay | Admitting: Family Medicine
